# Patient Record
Sex: Female | Born: 1990 | Race: Black or African American | Hispanic: No | Marital: Married | Smoking: Never smoker
Health system: Southern US, Community
[De-identification: ages and names within clinical notes are randomized; demographics above are authoritative.]

## PROBLEM LIST (undated history)

## (undated) DIAGNOSIS — Z789 Other specified health status: Secondary | ICD-10-CM

## (undated) HISTORY — DX: Other specified health status: Z78.9

## (undated) HISTORY — PX: NO PAST SURGERIES: SHX2092

---
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
## 2011-03-13 ENCOUNTER — Encounter (HOSPITAL_COMMUNITY): Payer: Self-pay | Admitting: *Deleted

## 2011-03-13 ENCOUNTER — Emergency Department (HOSPITAL_COMMUNITY): Payer: BC Managed Care – PPO

## 2011-03-13 ENCOUNTER — Other Ambulatory Visit: Payer: Self-pay

## 2011-03-13 ENCOUNTER — Emergency Department (HOSPITAL_COMMUNITY)
Admission: EM | Admit: 2011-03-13 | Discharge: 2011-03-13 | Disposition: A | Discharge status: Home / Self Care | Payer: BC Managed Care – PPO | Attending: Emergency Medicine | Admitting: Emergency Medicine

## 2011-03-13 DIAGNOSIS — J45909 Unspecified asthma, uncomplicated: Secondary | ICD-10-CM | POA: Insufficient documentation

## 2011-03-13 DIAGNOSIS — R55 Syncope and collapse: Secondary | ICD-10-CM

## 2011-03-13 DIAGNOSIS — E119 Type 2 diabetes mellitus without complications: Secondary | ICD-10-CM | POA: Insufficient documentation

## 2011-03-13 DIAGNOSIS — R079 Chest pain, unspecified: Secondary | ICD-10-CM | POA: Insufficient documentation

## 2011-03-13 LAB — POCT I-STAT, CHEM 8
BUN: 7 mg/dL (ref 6–23)
Calcium, Ion: 1.24 mmol/L (ref 1.12–1.32)
Chloride: 111 mEq/L (ref 96–112)
Creatinine, Ser: 0.7 mg/dL (ref 0.50–1.10)
Glucose, Bld: 114 mg/dL — ABNORMAL HIGH (ref 70–99)
HCT: 42 % (ref 36.0–46.0)
Hemoglobin: 14.3 g/dL (ref 12.0–15.0)
Potassium: 3.6 mEq/L (ref 3.5–5.1)
Sodium: 145 mEq/L (ref 135–145)
TCO2: 23 mmol/L (ref 0–100)

## 2011-03-13 LAB — PREGNANCY, URINE: Preg Test, Ur: NEGATIVE
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
## 2011-03-13 NOTE — ED Notes (Signed)
Patient transported to X-ray 

## 2011-03-13 NOTE — ED Notes (Signed)
MD at bedside. 
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
## 2011-06-08 ENCOUNTER — Encounter (HOSPITAL_COMMUNITY): Payer: Self-pay | Admitting: Emergency Medicine

## 2011-06-08 ENCOUNTER — Emergency Department (HOSPITAL_COMMUNITY)
Admission: EM | Admit: 2011-06-08 | Discharge: 2011-06-08 | Disposition: A | Discharge status: Home / Self Care | Payer: BC Managed Care – PPO | Attending: Emergency Medicine | Admitting: Emergency Medicine

## 2011-06-08 DIAGNOSIS — R1013 Epigastric pain: Secondary | ICD-10-CM | POA: Insufficient documentation

## 2011-06-08 DIAGNOSIS — R197 Diarrhea, unspecified: Secondary | ICD-10-CM | POA: Insufficient documentation

## 2011-06-08 DIAGNOSIS — R112 Nausea with vomiting, unspecified: Secondary | ICD-10-CM | POA: Insufficient documentation

## 2011-06-08 LAB — URINALYSIS, ROUTINE W REFLEX MICROSCOPIC
Bilirubin Urine: NEGATIVE
Glucose, UA: NEGATIVE mg/dL
Hgb urine dipstick: NEGATIVE
Ketones, ur: >80 mg/dL — AB
Nitrite: NEGATIVE
Protein, ur: NEGATIVE mg/dL
Specific Gravity, Urine: 1.022 (ref 1.005–1.030)
Urobilinogen, UA: 1 mg/dL (ref 0.0–1.0)
pH: 8 (ref 5.0–8.0)

## 2011-06-08 LAB — CBC
HCT: 40.5 % (ref 36.0–46.0)
Hemoglobin: 13.2 g/dL (ref 12.0–15.0)
MCH: 29.7 pg (ref 26.0–34.0)
MCHC: 32.6 g/dL (ref 30.0–36.0)
MCV: 91.2 fL (ref 78.0–100.0)
Platelets: 259 10*3/uL (ref 150–400)
RBC: 4.44 MIL/uL (ref 3.87–5.11)
RDW: 12.8 % (ref 11.5–15.5)
WBC: 6.8 10*3/uL (ref 4.0–10.5)

## 2011-06-08 LAB — DIFFERENTIAL
Basophils Absolute: 0 10*3/uL (ref 0.0–0.1)
Basophils Relative: 0 % (ref 0–1)
Eosinophils Absolute: 0 10*3/uL (ref 0.0–0.7)
Eosinophils Relative: 0 % (ref 0–5)
Lymphocytes Relative: 8 % — ABNORMAL LOW (ref 12–46)
Lymphs Abs: 0.6 10*3/uL — ABNORMAL LOW (ref 0.7–4.0)
Monocytes Absolute: 0.2 10*3/uL (ref 0.1–1.0)
Monocytes Relative: 3 % (ref 3–12)
Neutro Abs: 6 10*3/uL (ref 1.7–7.7)
Neutrophils Relative %: 89 % — ABNORMAL HIGH (ref 43–77)

## 2011-06-08 LAB — COMPREHENSIVE METABOLIC PANEL
ALT: 9 U/L (ref 0–35)
AST: 15 U/L (ref 0–37)
Albumin: 3.8 g/dL (ref 3.5–5.2)
Alkaline Phosphatase: 64 U/L (ref 39–117)
BUN: 8 mg/dL (ref 6–23)
CO2: 22 mEq/L (ref 19–32)
Calcium: 9.1 mg/dL (ref 8.4–10.5)
Chloride: 104 mEq/L (ref 96–112)
Creatinine, Ser: 0.64 mg/dL (ref 0.50–1.10)
GFR calc Af Amer: >90 mL/min (ref >90)
GFR calc non Af Amer: >90 mL/min (ref >90)
Glucose, Bld: 114 mg/dL — ABNORMAL HIGH (ref 70–99)
Potassium: 3.9 mEq/L (ref 3.5–5.1)
Sodium: 139 mEq/L (ref 135–145)
Total Bilirubin: 0.3 mg/dL (ref 0.3–1.2)
Total Protein: 7.4 g/dL (ref 6.0–8.3)

## 2011-06-08 LAB — POCT PREGNANCY, URINE: Preg Test, Ur: NEGATIVE

## 2011-06-08 LAB — LIPASE, BLOOD: Lipase: 15 U/L (ref 11–59)

## 2011-06-08 LAB — URINE MICROSCOPIC-ADD ON

## 2011-06-08 MED ORDER — HYDROCODONE-ACETAMINOPHEN 5-325 MG PO TABS: 1.0000 | ORAL_TABLET | ORAL | Status: AC | PRN

## 2011-06-08 MED ORDER — ONDANSETRON 4 MG PO TBDP: 4.0000 mg | ORAL_TABLET | Freq: Once | ORAL | Status: DC

## 2011-06-08 MED ORDER — ONDANSETRON 4 MG PO TBDP
4.0000 mg | ORAL_TABLET | Freq: Once | ORAL | Status: AC
  Administered 2011-06-08: 4 mg via ORAL
  Filled 2011-06-08: qty 1

## 2011-06-08 MED ORDER — SODIUM CHLORIDE 0.9 % IV BOLUS (SEPSIS)
1000.0000 mL | Freq: Once | INTRAVENOUS | Status: AC
  Administered 2011-06-08: 1000 mL via INTRAVENOUS

## 2011-06-08 MED ORDER — MORPHINE SULFATE 4 MG/ML IJ SOLN
4.0000 mg | Freq: Once | INTRAMUSCULAR | Status: AC
  Administered 2011-06-08: 4 mg via INTRAVENOUS
  Filled 2011-06-08: qty 1

## 2011-06-08 MED ORDER — ONDANSETRON HCL 4 MG/2ML IJ SOLN
INTRAMUSCULAR | Status: AC
  Administered 2011-06-08: 4 mg
  Filled 2011-06-08: qty 2

## 2011-06-08 MED ORDER — ONDANSETRON 4 MG PO TBDP: 4.0000 mg | ORAL_TABLET | Freq: Three times a day (TID) | ORAL | Status: AC | PRN

## 2011-06-08 NOTE — ED Notes (Signed)
Pt stated that she is not ready to urinate.

## 2011-06-08 NOTE — ED Notes (Signed)
Pt states she has vomiting for the past two days  Unable to hold anything down  Pt states she has had diarrhea also

## 2011-06-08 NOTE — ED Notes (Signed)
Pt and family updated on status.

## 2011-06-08 NOTE — Discharge Instructions (Signed)
You have been treated for nausea and vomiting today. Your labs today did not show any worrisome findings. You have been given prescriptions for pain and nausea medications. Take these as prescribed. Stick to a clear liquid diet during the day today. If your pain is not improving, changes location, or you are otherwise worsening, return to the ER.  RESOURCE GUIDE  Dental Problems  Patients with Medicaid: [facility] [phone]                                           [phone] [address] Phone:  [phone]                                                  Phone:  [phone]  If unable to pay or uninsured, contact:  [facility] or [facility]. to become qualified for the adult dental clinic.  Chronic Pain Problems Contact [facility]  [phone] Patients need to be referred by their primary care doctor.  Insufficient Money for Medicine Contact [employer]:  call "211" or [facility] ([phone].  No Primary Care Doctor Call [facility]  [phone] Other agencies that provide inexpensive medical care    [facility]  [phone]    [facility]  [phone]    [facility]  [phone]    [facility]  [phone]    [facility]  [phone]    [facility]  [phone]  Psychological Services [facility] Behavioral Health  [phone] [facility]  [phone] [facility] Mental Health   [phone] (emergency services ([phone])  Substance Abuse Resources [facility]  [phone] [facility] [phone] [facility] [place] [phone] Floydene Flock [phone] Residential & Outpatient Substance Abuse Program  [phone]  Abuse/Neglect [facility] Child Abuse Hotline (708)028-9613 [facility] Child Abuse Hotline 249-422-9623 (After Hours)  Emergency Shelter [facility] [phone]  Maternity Homes Room at the [place] [facility] [place] [phone] [facility] [phone]  MRSA Hotline #:   (267)237-9546    [facility] Resources  [facility]     [employer]                          [facility] Dept. [address] [place]                       [address]      [address]  [place]                                                Cristobal Goldmann Phone:  [phone]  Phone:  [phone]                 Phone:  [phone]  [facility] Mental Health Phone:  [phone]  [facility] Child Abuse Hotline (917)286-6516 9376054823 (After Hours)  Clear Liquid Diet The clear liquid dietconsists of foods that are liquid or will become liquid at room temperature.You should be able to see through the liquid and beverages. Examples of foods allowed on a clear liquid diet include fruit juice, broth or bouillon, gelatin, or frozen ice pops. The purpose of this diet is to provide necessary fluid, electrolytes such as sodium and potassium, and energy to keep the body functioning during times when you are not able to consume a regular diet.A clear liquid diet should not be continued for long periods of time as it is not nutritionally adequate.  REASONS FOR USING A CLEAR LIQUID DIET  In sudden onset (acute) conditions for a patient before or after surgery.   As the first step in oral feeding.   For fluid and electrolyte replacement in diarrheal diseases.   As a diet before certain medical tests are performed.  ADEQUACY The clear liquid diet is adequate only in ascorbic acid, according to the Recommended Dietary Allowances of the [employer]. CHOOSING FOODS Breads and Starches  Allowed:  None are allowed.   Avoid: All are avoided.  Vegetables  Allowed:  Strained tomato or vegetable juice.   Avoid: Any others.    Fruit  Allowed:  Strained fruit juices and fruit drinks. Include 1 serving of citrus or vitamin C-enriched fruit juice daily.   Avoid: Any others.  Meat and Meat Substitutes  Allowed:  None are allowed.   Avoid: All are avoided.  Milk  Allowed:  None are allowed.   Avoid: All are avoided.  Soups and Combination Foods  Allowed:  Clear bouillon, broth, or strained broth-based soups.   Avoid: Any others.  Desserts and Sweets  Allowed:  Sugar, honey. High protein gelatin. Flavored gelatin, ices, or frozen ice pops that do not contain milk.   Avoid: Any others.  Fats and Oils  Allowed:  None are allowed.   Avoid: All are avoided.  Beverages  Allowed: Cereal beverages, coffee (regular or decaffeinated), tea, or soda at the discretion of your caregiver.   Avoid: Any others.  Condiments  Allowed:  Iodized salt.   Avoid: Any others, including pepper.  Supplements  Allowed:  Liquid nutrition beverages.   Avoid: Any others that contain lactose or fiber.  SAMPLE MEAL PLAN Breakfast  4 oz (120 mL) strained orange juice.    to 1 cup (125 to 250 mL) gelatin (plain or fortified).   1 cup (250 mL) beverage (coffee or tea).   Sugar, if desired.  Midmorning Snack   cup (125 mL) gelatin (plain or fortified).  Lunch  1 cup (250 mL) broth or consomm.   4 oz (120 mL) strained grapefruit juice.    cup (125 mL) gelatin (plain or fortified).   1 cup (250 mL) beverage (coffee or tea).   Sugar, if desired.  Midafternoon Snack   cup (125 mL) fruit ice.    cup (125 mL) strained fruit juice.  Dinner  1 cup (250 mL) broth or consomm.    cup (125 mL) cranberry juice.    cup (125 mL) flavored gelatin (plain or fortified).   1 cup (250 mL) beverage (coffee or tea).   Sugar, if desired.  Evening Snack  4 oz (120  mL) strained apple juice (vitamin C-fortified).    cup (125 mL) flavored gelatin (plain or fortified).  Document Released: 02/15/2005  Document Revised: 02/04/2011 Document Reviewed: 05/15/2010 [facility] Patient Information 2012 [place], [place].Nausea and Vomiting Nausea is a sick feeling that often comes before throwing up (vomiting). Vomiting is a reflex where stomach contents come out of your mouth. Vomiting can cause severe loss of body fluids (dehydration). Children and elderly adults can become dehydrated quickly, especially if they also have diarrhea. Nausea and vomiting are symptoms of a condition or disease. It is important to find the cause of your symptoms. CAUSES   Direct irritation of the stomach lining. This irritation can result from increased acid production (gastroesophageal reflux disease), infection, food poisoning, taking certain medicines (such as nonsteroidal anti-inflammatory drugs), alcohol use, or tobacco use.   Signals from the brain.These signals could be caused by a headache, heat exposure, an inner ear disturbance, increased pressure in the brain from injury, infection, a tumor, or a concussion, pain, emotional stimulus, or metabolic problems.   An obstruction in the gastrointestinal tract (bowel obstruction).   Illnesses such as diabetes, hepatitis, gallbladder problems, appendicitis, kidney problems, cancer, sepsis, atypical symptoms of a heart attack, or eating disorders.   Medical treatments such as chemotherapy and radiation.   Receiving medicine that makes you sleep (general anesthetic) during surgery.  DIAGNOSIS Your caregiver may ask for tests to be done if the problems do not improve after a few days. Tests may also be done if symptoms are severe or if the reason for the nausea and vomiting is not clear. Tests may include:  Urine tests.   Blood tests.   Stool tests.   Cultures (to look for evidence of infection).   X-rays or other imaging studies.  Test results can help your caregiver make decisions about treatment or the need for additional tests. TREATMENT You need to stay well  hydrated. Drink frequently but in small amounts.You may wish to drink water, sports drinks, clear broth, or eat frozen ice pops or gelatin dessert to help stay hydrated.When you eat, eating slowly may help prevent nausea.There are also some antinausea medicines that may help prevent nausea. HOME CARE INSTRUCTIONS   Take all medicine as directed by your caregiver.   If you do not have an appetite, do not force yourself to eat. However, you must continue to drink fluids.   If you have an appetite, eat a normal diet unless your caregiver tells you differently.   Eat a variety of complex carbohydrates (rice, wheat, potatoes, bread), lean meats, yogurt, fruits, and vegetables.   Avoid high-fat foods because they are more difficult to digest.   Drink enough water and fluids to keep your urine clear or pale yellow.   If you are dehydrated, ask your caregiver for specific rehydration instructions. Signs of dehydration may include:   Severe thirst.   Dry lips and mouth.   Dizziness.   Dark urine.   Decreasing urine frequency and amount.   Confusion.   Rapid breathing or pulse.  SEEK IMMEDIATE MEDICAL CARE IF:   You have blood or brown flecks (like coffee grounds) in your vomit.   You have black or bloody stools.   You have a severe headache or stiff neck.   You are confused.   You have severe abdominal pain.   You have chest pain or trouble breathing.   You do not urinate at least once every 8 hours.   You develop cold or clammy skin.  You continue to vomit for longer than 24 to 48 hours.   You have a fever.  MAKE SURE YOU:   Understand these instructions.   Will watch your condition.   Will get help right away if you are not doing well or get worse.  Document Released: 02/15/2005 Document Revised: 02/04/2011 Document Reviewed: 07/15/2010 [facility] Patient Information 2012 [place], [place].

## 2011-06-08 NOTE — ED Notes (Signed)
Pt actively vomiting in triage 

## 2011-06-08 NOTE — ED Notes (Signed)
Report received from Night RN. First contact with patient. Pt is up for discharge upon taking over this pt's care. Will get discharge instruction and discharge pt.

## 2011-06-08 NOTE — ED Provider Notes (Signed)
History     CSN: [id number]  Arrival date & time 06/08/11  0105   First MD Initiated Contact with Patient 06/08/11 0243      Chief Complaint  Patient presents with  . Emesis    (Consider location/radiation/quality/duration/timing/severity/associated sxs/prior treatment) Patient is a 21 y.o. female presenting with vomiting. The history is provided by the patient.  Emesis  This is a new problem. The current episode started 2 days ago. The problem occurs 5 to 10 times per day. The problem has not changed since onset.The emesis has an appearance of stomach contents. There has been no fever. Associated symptoms include abdominal pain and diarrhea. Pertinent negatives include no arthralgias, no chills, no cough, no fever, no headaches, no myalgias, no sweats and no URI.   N/V/D x past 2 days with epigastric abd pain that developed after several episodes of vomiting. No f/c, chest pain, shortness of breath, leg swelling. No vaginal bleeding, dc, urinary sx. Nonbilious, nonbloody emesis. Diarrhea described as multiple episodes of loose stools. She is unable to tolerate PO. No known sick contacts, travel, or suspect food intake.  History reviewed. No pertinent past medical history.  History reviewed. No pertinent past surgical history.  History reviewed. No pertinent family history.  History  Substance Use Topics  . Smoking status: Never Smoker   . Smokeless tobacco: Not on file  . Alcohol Use: Yes     social    OB History    Grav Para Term Preterm Abortions TAB SAB Ect Mult Living                  Review of Systems  Constitutional: Negative for fever and chills.  HENT: Negative.   Respiratory: Negative for cough.   Cardiovascular: Negative for chest pain.  Gastrointestinal: Positive for nausea, vomiting, abdominal pain and diarrhea. Negative for constipation, blood in stool, abdominal distention, anal bleeding and rectal pain.  Genitourinary: Negative for dysuria, flank pain,  vaginal bleeding and vaginal discharge.  Musculoskeletal: Negative for myalgias and arthralgias.  Skin: Negative.   Neurological: Negative for headaches.    Allergies  Review of patient's allergies indicates no known allergies.  Home Medications   Current Outpatient Rx  Name Route Sig Dispense Refill  . IBUPROFEN 200 MG PO TABS Oral Take 800 mg by mouth every 6 (six) hours as needed. For pain/fever/cramping    . HYDROCORTISONE PO Topical Apply 1 application topically daily as needed. For itching      BP 125/56  Pulse 92  Temp(Src) 98.5 F (36.9 C) (Oral)  Resp 18  Wt 178 lb 3.2 oz (80.831 kg)  SpO2 100%  LMP 06/01/2011  Physical Exam  Nursing note and vitals reviewed. Constitutional: She appears well-developed and well-nourished. No distress.       Uncomfortable appearing  HENT:  Head: Normocephalic and atraumatic.  Right Ear: External ear normal.  Left Ear: External ear normal.       Mucus membranes dry appearing  Eyes: EOM are normal. Pupils are equal, round, and reactive to light.  Neck: Normal range of motion.  Cardiovascular: Normal rate, regular rhythm and normal heart sounds.   Pulmonary/Chest: Effort normal and breath sounds normal. She exhibits no tenderness.  Abdominal: Soft. Bowel sounds are normal. There is tenderness. There is no rebound and no guarding.       Slight epigastric abd tenderness without guarding  Musculoskeletal: Normal range of motion.  Neurological: She is alert.  Skin: Skin is warm and dry. No rash  noted. She is not diaphoretic.       Nl skin turgor  Psychiatric: She has a normal mood and affect.    ED Course  Procedures (including critical care time)  Labs Reviewed  DIFFERENTIAL - Abnormal; Notable for the following:    Neutrophils Relative 89 (*)    Lymphocytes Relative 8 (*)    Lymphs Abs 0.6 (*)    All other components within normal limits  COMPREHENSIVE METABOLIC PANEL - Abnormal; Notable for the following:    Glucose, Bld  114 (*)    All other components within normal limits  URINALYSIS, ROUTINE W REFLEX MICROSCOPIC - Abnormal; Notable for the following:    APPearance TURBID (*)    Ketones, ur >80 (*)    Leukocytes, UA SMALL (*)    All other components within normal limits  URINE MICROSCOPIC-ADD ON - Abnormal; Notable for the following:    Squamous Epithelial / LPF FEW (*)    Bacteria, UA FEW (*)    All other components within normal limits  LIPASE, BLOOD  CBC  POCT PREGNANCY, URINE   No results found.   1. Nausea and vomiting       MDM  Pt presents with n/v/d x 2 days, no PO intake. Appears slightly dehydrated. VSS. No lyte abnormalities but she does have >80 ketones in urine. Rehydrated here with 2L NS and feeling better with this. After zofran, PO chall attempted which she was able to tolerate.   Given abd exam and labs, I doubt acute abd at this point. Suspect likely viral process. However, pt and mom advised that if pain changes, worsens, or migrates toward RLQ, she is to return immediately for serial exam. Pt and mom verbalized understanding and were agreeable with plan.       Grant Fontana, [place] 06/10/11 1243

## 2011-06-08 NOTE — ED Notes (Signed)
Unable to gain iv access; two attempts performed.

## 2011-06-12 NOTE — ED Provider Notes (Signed)
Medical screening examination/treatment/procedure(s) were performed by non-physician practitioner and as supervising physician I was immediately available for consultation/collaboration.  Alicea Wente M Valeriano Bain, MD 06/12/11 0122 

## 2011-06-14 ENCOUNTER — Encounter (HOSPITAL_COMMUNITY): Payer: Self-pay | Admitting: Emergency Medicine

## 2012-06-26 ENCOUNTER — Ambulatory Visit (INDEPENDENT_AMBULATORY_CARE_PROVIDER_SITE_OTHER): Payer: BC Managed Care – PPO | Admitting: Obstetrics & Gynecology

## 2012-06-26 ENCOUNTER — Encounter: Payer: Self-pay | Admitting: Obstetrics & Gynecology

## 2012-06-26 VITALS — BP 100/68 | HR 84 | Temp 97.5°F | Ht 63.0 in | Wt 175.0 lb

## 2012-06-26 DIAGNOSIS — N946 Dysmenorrhea, unspecified: Secondary | ICD-10-CM

## 2012-06-26 DIAGNOSIS — R55 Syncope and collapse: Secondary | ICD-10-CM

## 2012-06-26 DIAGNOSIS — Z3202 Encounter for pregnancy test, result negative: Secondary | ICD-10-CM

## 2012-06-26 DIAGNOSIS — R87610 Atypical squamous cells of undetermined significance on cytologic smear of cervix (ASC-US): Secondary | ICD-10-CM | POA: Insufficient documentation

## 2012-06-26 LAB — POCT URINE PREGNANCY: Preg Test, Ur: NEGATIVE

## 2012-06-26 MED ORDER — LEVONORGEST-ETH ESTRAD 91-DAY 0.15-0.03 &0.01 MG PO TABS: 1.0000 | ORAL_TABLET | Freq: Every day | ORAL | Status: DC

## 2012-06-26 NOTE — Progress Notes (Signed)
Subjective:    April [name] is a 22 y.o. female who presents for evaluation of menstrual symptoms "passing out". Symptoms began 5 days ago. Patient describes symptoms of anxiety (mild), bloating/fluid retention (moderate), breast tenderness (mild) and menstrual cramping (severe). Symptoms occur 2 days prior to onset of menses. Patient denies decreased libido, dyspareunia, insomnia, labile mood, menorrhagia and migraine headaches.  Treatment to date includes: Oral contraceptive pills per medication list:(somewhat effective). The patient is sexually active.   Menstrual History: OB History   Grav Para Term Preterm Abortions TAB SAB Ect Mult Living   0 0 0 0 0 0 0 0 0 0       Menarche age: 44  Patient's last menstrual period was 06/21/2012.    The following portions of the patient's history were reviewed and updated as appropriate: allergies, current medications, past family history, past medical history, past social history, past surgical history and problem list.  Review of Systems Pertinent items are noted in HPI.   Objective:    BP 107/74  Pulse 84  Temp(Src) 97.5 F (36.4 C) (Oral)  Ht 5\' 3"  (1.6 m)  Wt 175 lb (79.379 kg)  BMI 31.01 kg/m2  LMP 06/21/2012 Exam deferred  Assessment:    PMS/dysmenorrhea ?Presyncope--not orthostatic by vital signs  Plan:  Encouraged f/u w/primary care provider Return prn

## 2012-06-26 NOTE — Patient Instructions (Signed)
Near-Syncope  Near-syncope is sudden weakness, dizziness, or feeling like you might pass out (faint). This may occur when getting up after sitting or while standing for a long period of time. Near-syncope can be caused by a drop in blood pressure. This is a common reaction, but it may occur to a greater degree in people taking medicines to control their blood pressure. Fainting often occurs when the blood pressure or pulse is too low to provide enough blood flow to the brain to keep you conscious. Fainting and near-syncope are not usually due to serious medical problems. However, certain people should be more cautious in the event of near-syncope, including elderly patients, patients with diabetes, and patients with a history of heart conditions (especially irregular rhythms).   CAUSES    Drop in blood pressure.   Physical pain.   Dehydration.   Heat exhaustion.   Emotional distress.   Low blood sugar.   Internal bleeding.   Heart and circulatory problems.   Infections.  SYMPTOMS    Dizziness.   Feeling sick to your stomach (nauseous).   Nearly fainting.   Body numbness.   Turning pale.   Tunnel vision.   Weakness.  HOME CARE INSTRUCTIONS    Lie down right away if you start feeling like you might faint. Breathe deeply and steadily. Wait until all the symptoms have passed. Most of these episodes last only a few minutes. You may feel tired for several hours.   Drink enough fluids to keep your urine clear or pale yellow.   If you are taking blood pressure or heart medicine, get up slowly, taking several minutes to sit and then stand. This can reduce dizziness that is caused by a drop in blood pressure.  SEEK IMMEDIATE MEDICAL CARE IF:    You have a severe headache.   Unusual pain develops in the chest, abdomen, or back.   There is bleeding from the mouth or rectum, or you have black or tarry stool.   An irregular heartbeat or a very rapid pulse develops.   You have repeated fainting or  seizure-like jerking during an episode.   You faint when sitting or lying down.   You develop confusion.   You have difficulty walking.   Severe weakness develops.   Vision problems develop.  MAKE SURE YOU:    Understand these instructions.   Will watch your condition.   Will get help right away if you are not doing well or get worse.  Document Released: 02/15/2005 Document Revised: 05/10/2011 Document Reviewed: 04/03/2010  [employer] Patient Information 2013 [employer].

## 2013-11-06 ENCOUNTER — Telehealth: Payer: Self-pay | Admitting: *Deleted

## 2013-11-06 NOTE — Telephone Encounter (Signed)
Patient called stating she was requesting birth control pills. Patient states she would like an Rx sent for generic BC pill to [facility] on [address] and [address].   CB: 3:13pm, LM stating patient needs to be schedule for an appointment and since patient does not get off work until 6:00pm for the patient to contact the office tomorrow morning at 9:00am to schedule an appointment.   Patient is due for an annual exam. Last Pap-smear was in 12/2011, results were ASC-US.

## 2014-06-11 ENCOUNTER — Ambulatory Visit (INDEPENDENT_AMBULATORY_CARE_PROVIDER_SITE_OTHER): Payer: BLUE CROSS/BLUE SHIELD | Admitting: Emergency Medicine

## 2014-06-11 VITALS — BP 110/64 | HR 74 | Temp 98.2°F | Resp 20 | Ht 63.0 in | Wt 232.1 lb

## 2014-06-11 DIAGNOSIS — A084 Viral intestinal infection, unspecified: Secondary | ICD-10-CM | POA: Diagnosis not present

## 2014-06-11 MED ORDER — ONDANSETRON 8 MG PO TBDP: 8.0000 mg | ORAL_TABLET | Freq: Three times a day (TID) | ORAL | Status: DC | PRN

## 2014-06-11 MED ORDER — LOPERAMIDE HCL 2 MG PO TABS
ORAL_TABLET | ORAL | Status: DC
Start: 2014-06-11 — End: 2015-06-06

## 2014-06-11 NOTE — Progress Notes (Signed)
[facility] [address], [place] [address] [phone]  Date:  06/11/2014   Name:  April [name]   DOB:  11/29/1990   MRN:  [id number]  PCP:  Cala BradfordWHITE,CYNTHIA S, MD    Chief Complaint: Abdominal Pain; Diarrhea; and Emesis   History of Present Illness:  April [name] is a 24 y.o. very pleasant female patient who presents with the following:  Well on retiring last night.  Today awoke with nausea and vomiting.  Some diarrhea The patient has no complaint of blood, mucous, or pus in her stools. No sketchy water or travel No ill contact Feels hot and alternately chilled with no fever documented. Some lower abdominal cramping but started menses today Poor po intake today. No provocative or ameliorating factors noted. No improvement with over the counter medications or other home remedies.  Denies other complaint or health concern today.   Patient Active Problem List   Diagnosis Date Noted  . Papanicolaou smear of cervix with atypical squamous cells of undetermined significance (ASC-US) 06/26/2012  . Dysmenorrhea 06/26/2012    Past Medical History  Diagnosis Date  . Medical history non-contributory     Past Surgical History  Procedure Laterality Date  . No past surgeries      History  Substance Use Topics  . Smoking status: Never Smoker   . Smokeless tobacco: Never Used  . Alcohol Use: 0.0 oz/week    0 Standard drinks or equivalent per week     Comment: social    Family History  Problem Relation Age of Onset  . Diabetes Father   . Heart disease Maternal Grandmother   . Lung cancer Paternal Grandfather     smoker    No Known Allergies  Medication list has been reviewed and updated.  Current Outpatient Prescriptions on File Prior to Visit  Medication Sig Dispense Refill  . ibuprofen (ADVIL,MOTRIN) 200 MG tablet Take 800 mg by mouth every 6 (six) hours as needed. For pain/fever/cramping     No current facility-administered  medications on file prior to visit.    Review of Systems:  As per HPI, otherwise negative.    Physical Examination: Filed Vitals:   06/11/14 1003  BP: 110/64  Pulse: 74  Temp: 98.2 F (36.8 C)  Resp: 20   Filed Vitals:   06/11/14 1003  Height: 5\' 3"  (1.6 m)  Weight: 232 lb 2 oz (105.291 kg)   Body mass index is 41.13 kg/(m^2). Ideal Body Weight: Weight in (lb) to have BMI = 25: 140.8  GEN: obese, moderate distress, Non-toxic, A & O x 3 HEENT: Atraumatic, Normocephalic. Neck supple. No masses, No LAD. Ears and Nose: No external deformity. CV: RRR, No M/G/R. No JVD. No thrill. No extra heart sounds. PULM: CTA B, no wheezes, crackles, rhonchi. No retractions. No resp. distress. No accessory muscle use. ABD: S, NT, ND, +BS. No rebound. No HSM. EXTR: No c/c/e NEURO Normal gait.  PSYCH: Normally interactive. Conversant. Not depressed or anxious appearing.  Calm demeanor.    Assessment and Plan: Viral gastroenteritis zofran Imodium Clears  Signed,  Phillips OdorJeffery Nelida Mandarino, MD

## 2014-06-11 NOTE — Patient Instructions (Signed)
Clear Liquid Diet A clear liquid diet is a short-term diet that is prescribed to provide the necessary fluid and basic energy you need when you can have nothing else. The clear liquid diet consists of liquids or solids that will become liquid at room temperature. You should be able to see through the liquid. There are many reasons that you may be restricted to clear liquids, such as:  When you have a sudden-onset (acute) condition that occurs before or after surgery.  To help your body slowly get adjusted to food again after a long period when you were unable to have food.  Replacement of fluids when you have a diarrheal disease.  When you are going to have certain exams, such as a colonoscopy, in which instruments are inserted inside your body to look at parts of your digestive system. WHAT CAN I HAVE? A clear liquid diet does not provide all the nutrients you need. It is important to choose a variety of the following items to get as many nutrients as possible:  Vegetable juices that do not have pulp.  Fruit juices and fruit drinks that do not have pulp.  Coffee (regular or decaffeinated), tea, or soda at the discretion of your health care provider.  Clear bouillon, broth, or strained broth-based soups.  High-protein and flavored gelatins.  Sugar or honey.  Ices or frozen ice pops that do not contain milk. If you are not sure whether you can have certain items, you should ask your health care provider. You may also ask your health care provider if there are any other clear liquid options. Document Released: 02/15/2005 Document Revised: 02/20/2013 Document Reviewed: 01/12/2013 ExitCare Patient Information 2015 [employer]. This information is not intended to replace advice given to you by your health care provider. Make sure you discuss any questions you have with your health care provider. Viral Gastroenteritis Viral gastroenteritis is also known as stomach flu. This condition  affects the stomach and intestinal tract. It can cause sudden diarrhea and vomiting. The illness typically lasts 3 to 8 days. Most people develop an immune response that eventually gets rid of the virus. While this natural response develops, the virus can make you quite ill. CAUSES  Many different viruses can cause gastroenteritis, such as rotavirus or noroviruses. You can catch one of these viruses by consuming contaminated food or water. You may also catch a virus by sharing utensils or other personal items with an infected person or by touching a contaminated surface. SYMPTOMS  The most common symptoms are diarrhea and vomiting. These problems can cause a severe loss of body fluids (dehydration) and a body salt (electrolyte) imbalance. Other symptoms may include:  Fever.  Headache.  Fatigue.  Abdominal pain. DIAGNOSIS  Your caregiver can usually diagnose viral gastroenteritis based on your symptoms and a physical exam. A stool sample may also be taken to test for the presence of viruses or other infections. TREATMENT  This illness typically goes away on its own. Treatments are aimed at rehydration. The most serious cases of viral gastroenteritis involve vomiting so severely that you are not able to keep fluids down. In these cases, fluids must be given through an intravenous line (IV). HOME CARE INSTRUCTIONS   Drink enough fluids to keep your urine clear or pale yellow. Drink small amounts of fluids frequently and increase the amounts as tolerated.  Ask your caregiver for specific rehydration instructions.  Avoid:  Foods high in sugar.  Alcohol.  Carbonated drinks.  Tobacco.  Juice.    Caffeine drinks.  Extremely hot or cold fluids.  Fatty, greasy foods.  Too much intake of anything at one time.  Dairy products until 24 to 48 hours after diarrhea stops.  You may consume probiotics. Probiotics are active cultures of beneficial bacteria. They may lessen the amount and  number of diarrheal stools in adults. Probiotics can be found in yogurt with active cultures and in supplements.  Wash your hands well to avoid spreading the virus.  Only take over-the-counter or prescription medicines for pain, discomfort, or fever as directed by your caregiver. Do not give aspirin to children. Antidiarrheal medicines are not recommended.  Ask your caregiver if you should continue to take your regular prescribed and over-the-counter medicines.  Keep all follow-up appointments as directed by your caregiver. SEEK IMMEDIATE MEDICAL CARE IF:   You are unable to keep fluids down.  You do not urinate at least once every 6 to 8 hours.  You develop shortness of breath.  You notice blood in your stool or vomit. This may look like coffee grounds.  You have abdominal pain that increases or is concentrated in one small area (localized).  You have persistent vomiting or diarrhea.  You have a fever.  The patient is a child younger than 3 months, and he or she has a fever.  The patient is a child older than 3 months, and he or she has a fever and persistent symptoms.  The patient is a child older than 3 months, and he or she has a fever and symptoms suddenly get worse.  The patient is a baby, and he or she has no tears when crying. MAKE SURE YOU:   Understand these instructions.  Will watch your condition.  Will get help right away if you are not doing well or get worse. Document Released: 02/15/2005 Document Revised: 05/10/2011 Document Reviewed: 12/02/2010 ExitCare Patient Information 2015 [employer]. This information is not intended to replace advice given to you by your health care provider. Make sure you discuss any questions you have with your health care provider.  

## 2014-07-16 ENCOUNTER — Ambulatory Visit (INDEPENDENT_AMBULATORY_CARE_PROVIDER_SITE_OTHER): Payer: BLUE CROSS/BLUE SHIELD | Admitting: Obstetrics

## 2014-07-16 VITALS — BP 122/73 | HR 69 | Temp 98.5°F | Wt 230.6 lb

## 2014-07-16 DIAGNOSIS — Z30011 Encounter for initial prescription of contraceptive pills: Secondary | ICD-10-CM | POA: Diagnosis not present

## 2014-07-16 DIAGNOSIS — N946 Dysmenorrhea, unspecified: Secondary | ICD-10-CM | POA: Diagnosis not present

## 2014-07-16 MED ORDER — LEVONORGESTREL-ETHINYL ESTRAD 0.15-30 MG-MCG PO TABS: 1.0000 | ORAL_TABLET | Freq: Every day | ORAL | Status: DC

## 2014-07-16 MED ORDER — IBUPROFEN 800 MG PO TABS: 800.0000 mg | ORAL_TABLET | Freq: Three times a day (TID) | ORAL | Status: DC | PRN

## 2014-07-17 ENCOUNTER — Encounter: Payer: Self-pay | Admitting: Obstetrics

## 2014-07-17 NOTE — Progress Notes (Signed)
Patient ID: April [name], female   DOB: 12/16/1990, 24 y.o.   MRN: [id number]  Chief Complaint  Patient presents with  . Follow-up    HPI April [name] is a 24 y.o. female.  Heavy and painful periods.  Wants contraception.  HPI  Past Medical History  Diagnosis Date  . Medical history non-contributory     Past Surgical History  Procedure Laterality Date  . No past surgeries      Family History  Problem Relation Age of Onset  . Diabetes Father   . Heart disease Maternal Grandmother   . Lung cancer Paternal Grandfather     smoker    Social History History  Substance Use Topics  . Smoking status: Never Smoker   . Smokeless tobacco: Never Used  . Alcohol Use: 0.0 oz/week    0 Standard drinks or equivalent per week     Comment: social    No Known Allergies  Current Outpatient Prescriptions  Medication Sig Dispense Refill  . ibuprofen (ADVIL,MOTRIN) 800 MG tablet Take 1 tablet (800 mg total) by mouth every 8 (eight) hours as needed. 30 tablet 5  . levonorgestrel-ethinyl estradiol (NORDETTE) 0.15-30 MG-MCG tablet Take 1 tablet by mouth daily. 1 Package 11  . loperamide (IMODIUM A-D) 2 MG tablet 2 now and one hourly prn diarrhea.  Max 8 tabs in 24 hours (Patient not taking: Reported on 07/16/2014) 30 tablet 0  . ondansetron (ZOFRAN-ODT) 8 MG disintegrating tablet Take 1 tablet (8 mg total) by mouth every 8 (eight) hours as needed for nausea. (Patient not taking: Reported on 07/16/2014) 30 tablet 0   No current facility-administered medications for this visit.    Review of Systems Review of Systems Constitutional: negative for fatigue and weight loss Respiratory: negative for cough and wheezing Cardiovascular: negative for chest pain, fatigue and palpitations Gastrointestinal: negative for abdominal pain and change in bowel habits Genitourinary:negative Integument/breast: negative for nipple discharge Musculoskeletal:negative for myalgias Neurological: negative  for gait problems and tremors Behavioral/Psych: negative for abusive relationship, depression Endocrine: negative for temperature intolerance     Blood pressure 122/73, pulse 69, temperature 98.5 F (36.9 C), weight 230 lb 9.6 oz (104.599 kg), last menstrual period 07/07/2014.  Physical Exam Physical Exam General:   alert  Skin:   no rash or abnormalities  Lungs:   clear to auscultation bilaterally  Heart:   regular rate and rhythm, S1, S2 normal, no murmur, click, rub or gallop  Breasts:   normal without suspicious masses, skin or nipple changes or axillary nodes  Abdomen:  normal findings: no organomegaly, soft, non-tender and no hernia  Pelvis:  External genitalia: normal general appearance Urinary system: urethral meatus normal and bladder without fullness, nontender Vaginal: normal without tenderness, induration or masses Cervix: normal appearance Adnexa: normal bimanual exam Uterus: anteverted and non-tender, normal size      Data Reviewed none  Assessment     Dysmenorrhea. Contraceptive management     Plan    Nordette 28 Rx Ibuprofen Rx F/U 4 months.  No orders of the defined types were placed in this encounter.   Meds ordered this encounter  Medications  . levonorgestrel-ethinyl estradiol (NORDETTE) 0.15-30 MG-MCG tablet    Sig: Take 1 tablet by mouth daily.    Dispense:  1 Package    Refill:  11  . ibuprofen (ADVIL,MOTRIN) 800 MG tablet    Sig: Take 1 tablet (800 mg total) by mouth every 8 (eight) hours as needed.    Dispense:  30 tablet    Refill:  5

## 2014-11-13 ENCOUNTER — Ambulatory Visit: Payer: BLUE CROSS/BLUE SHIELD | Admitting: Obstetrics

## 2015-06-06 ENCOUNTER — Encounter: Payer: Self-pay | Admitting: Obstetrics

## 2015-06-06 ENCOUNTER — Ambulatory Visit (INDEPENDENT_AMBULATORY_CARE_PROVIDER_SITE_OTHER): Payer: BLUE CROSS/BLUE SHIELD | Admitting: Certified Nurse Midwife

## 2015-06-06 VITALS — BP 136/80 | HR 93 | Temp 99.3°F | Wt 246.0 lb

## 2015-06-06 DIAGNOSIS — Z3009 Encounter for other general counseling and advice on contraception: Secondary | ICD-10-CM

## 2015-06-06 NOTE — Progress Notes (Signed)
Patient ID: April [name], female   DOB: 12/22/1990, 25 y.o.   MRN: [id number]  Chief Complaint  Patient presents with  . Gynecologic Exam    Consult- patient is in the office to discuss her birth control options.    HPI April [name] is a 25 y.o. female.  Here for contraception counseling.  Discussed various options.  Is currently taking her pill daily at the same time every day.  With a stable partner.  Desires more effective contraception.  Discussed Kyleena versus Nexplanon.  Information given to patient.  Patient desires to have IUD placed while on her period in May.  Instructions given to patient.      HPI  Past Medical History  Diagnosis Date  . Medical history non-contributory     Past Surgical History  Procedure Laterality Date  . No past surgeries      Family History  Problem Relation Age of Onset  . Diabetes Father   . Heart disease Maternal Grandmother   . Lung cancer Paternal Grandfather     smoker    Social History Social History  Substance Use Topics  . Smoking status: Never Smoker   . Smokeless tobacco: Never Used  . Alcohol Use: 0.0 oz/week    0 Standard drinks or equivalent per week     Comment: social    No Known Allergies  Current Outpatient Prescriptions  Medication Sig Dispense Refill  . ibuprofen (ADVIL,MOTRIN) 800 MG tablet Take 1 tablet (800 mg total) by mouth every 8 (eight) hours as needed. 30 tablet 5  . levonorgestrel-ethinyl estradiol (NORDETTE) 0.15-30 MG-MCG tablet Take 1 tablet by mouth daily. 1 Package 11   No current facility-administered medications for this visit.    Review of Systems Review of Systems Constitutional: negative for fatigue and weight loss Respiratory: negative for cough and wheezing Cardiovascular: negative for chest pain, fatigue and palpitations Gastrointestinal: negative for abdominal pain and change in bowel habits Genitourinary:negative Integument/breast: negative for nipple  discharge Musculoskeletal:negative for myalgias Neurological: negative for gait problems and tremors Behavioral/Psych: negative for abusive relationship, depression Endocrine: negative for temperature intolerance     Blood pressure 136/80, pulse 93, temperature 99.3 F (37.4 C), weight 246 lb (111.585 kg), last menstrual period 05/19/2015.  Physical Exam Physical Exam General:   alert  Skin:   no rash or abnormalities  Lungs:   clear to auscultation bilaterally  Heart:   regular rate and rhythm, S1, S2 normal, no murmur, click, rub or gallop  Breasts:   normal without suspicious masses, skin or nipple changes or axillary nodes  Abdomen:  normal findings: no organomegaly, soft, non-tender and no hernia  Pelvis:  External genitalia: normal general appearance Urinary system: urethral meatus normal and bladder without fullness, nontender Vaginal: normal without tenderness, induration or masses Cervix: normal appearance Adnexa: normal bimanual exam Uterus: anteverted and non-tender, normal size    100% of 15 min visit spent on counseling and coordination of care.   Data Reviewed Previous medical hx, meds  Assessment     Contraception counseling     Plan    No orders of the defined types were placed in this encounter.   No orders of the defined types were placed in this encounter.     Possible management options include: routiene exam Follow up in  May with IUD.

## 2015-07-09 ENCOUNTER — Ambulatory Visit (INDEPENDENT_AMBULATORY_CARE_PROVIDER_SITE_OTHER): Payer: BLUE CROSS/BLUE SHIELD | Admitting: Certified Nurse Midwife

## 2015-07-09 VITALS — BP 114/81 | HR 81 | Wt 242.0 lb

## 2015-07-09 DIAGNOSIS — Z30014 Encounter for initial prescription of intrauterine contraceptive device: Secondary | ICD-10-CM

## 2015-07-09 DIAGNOSIS — Z1389 Encounter for screening for other disorder: Secondary | ICD-10-CM | POA: Diagnosis not present

## 2015-07-09 DIAGNOSIS — N390 Urinary tract infection, site not specified: Secondary | ICD-10-CM

## 2015-07-09 DIAGNOSIS — Z30430 Encounter for insertion of intrauterine contraceptive device: Secondary | ICD-10-CM

## 2015-07-09 DIAGNOSIS — Z113 Encounter for screening for infections with a predominantly sexual mode of transmission: Secondary | ICD-10-CM

## 2015-07-09 DIAGNOSIS — Z3202 Encounter for pregnancy test, result negative: Secondary | ICD-10-CM

## 2015-07-09 DIAGNOSIS — Z01818 Encounter for other preprocedural examination: Secondary | ICD-10-CM

## 2015-07-09 LAB — POCT URINALYSIS DIPSTICK
Bilirubin, UA: NEGATIVE
Blood, UA: 250
Glucose, UA: NEGATIVE
Leukocytes, UA: NEGATIVE
Nitrite, UA: POSITIVE
Protein, UA: NEGATIVE
Spec Grav, UA: 1.01
Urobilinogen, UA: NEGATIVE
pH, UA: 7

## 2015-07-09 LAB — POCT URINE PREGNANCY: Preg Test, Ur: NEGATIVE

## 2015-07-09 MED ORDER — NITROFURANTOIN MONOHYD MACRO 100 MG PO CAPS: 100.0000 mg | ORAL_CAPSULE | Freq: Two times a day (BID) | ORAL | Status: AC

## 2015-07-09 NOTE — Progress Notes (Signed)
Patient ID: April [name], female   DOB: 12/02/1990, 25 y.o.   MRN: [id number]  IUD Procedure Note   DIAGNOSIS: Desires long-term, reversible contraception   PROCEDURE: IUD placement Performing Provider: Orvilla Cornwallachelle Audley Hinojos CNM  Patient counseled prior to procedure. I explained risks and benefits of Kyleena IUD, reviewed alternative forms of contraception. Patient stated understanding and consented to continue with procedure.   LMP: 07/08/15 Pregnancy Test: Negative Lot #: [id number] Expiration Date: March 2018   IUD type: [   ] Mirena   [    ] Paraguard  [   ] Skyla   [X]   Kyleena  PROCEDURE:  Timeout procedure was performed to ensure right patient and right site.  A bimanual exam was performed to determine the position of the uterus, retroverted. The speculum was placed. The vagina and cervix was sterilized in the usual manner and sterile technique was maintained throughout the course of the procedure. A single toothed tenaculum was applied to the posterior lip of the cervix and gentle traction applied. The depth of the uterus was sounded to 9 cm. With gentle traction on the tenaculum, the IUD was inserted to the appropriate depth and inserted without difficulty.  The string was cut to an estimated 4 cm length. Bleeding was minimal. The patient tolerated the procedure well.   Follow up: The patient tolerated the procedure well without complications.  Standard post-procedure care is explained and return precautions are given.  Orvilla Cornwallachelle Waverley Krempasky CNM

## 2015-07-11 ENCOUNTER — Other Ambulatory Visit: Payer: Self-pay | Admitting: Certified Nurse Midwife

## 2015-07-13 LAB — NUSWAB VG+, CANDIDA 6SP
Atopobium vaginae: HIGH Score — AB
BVAB 2: HIGH Score — AB
Candida albicans, NAA: POSITIVE — AB
Candida glabrata, NAA: NEGATIVE
Candida krusei, NAA: NEGATIVE
Candida lusitaniae, NAA: NEGATIVE
Candida parapsilosis, NAA: NEGATIVE
Candida tropicalis, NAA: NEGATIVE
Chlamydia trachomatis, NAA: NEGATIVE
Neisseria gonorrhoeae, NAA: NEGATIVE
Trich vag by NAA: NEGATIVE

## 2015-07-13 LAB — URINE CULTURE

## 2015-07-14 ENCOUNTER — Encounter: Payer: Self-pay | Admitting: *Deleted

## 2015-07-14 ENCOUNTER — Other Ambulatory Visit: Payer: Self-pay | Admitting: Certified Nurse Midwife

## 2015-07-14 DIAGNOSIS — B9689 Other specified bacterial agents as the cause of diseases classified elsewhere: Secondary | ICD-10-CM

## 2015-07-14 DIAGNOSIS — B373 Candidiasis of vulva and vagina: Secondary | ICD-10-CM

## 2015-07-14 DIAGNOSIS — N760 Acute vaginitis: Secondary | ICD-10-CM

## 2015-07-14 DIAGNOSIS — B3731 Acute candidiasis of vulva and vagina: Secondary | ICD-10-CM

## 2015-07-14 MED ORDER — FLUCONAZOLE 100 MG PO TABS: 100.0000 mg | ORAL_TABLET | Freq: Once | ORAL | Status: DC

## 2015-07-14 MED ORDER — TERCONAZOLE 0.8 % VA CREA: 1.0000 | TOPICAL_CREAM | Freq: Every day | VAGINAL | Status: DC

## 2015-07-14 MED ORDER — METRONIDAZOLE 500 MG PO TABS: 500.0000 mg | ORAL_TABLET | Freq: Two times a day (BID) | ORAL | Status: DC

## 2015-08-06 ENCOUNTER — Ambulatory Visit (INDEPENDENT_AMBULATORY_CARE_PROVIDER_SITE_OTHER): Payer: BLUE CROSS/BLUE SHIELD | Admitting: Certified Nurse Midwife

## 2015-08-06 ENCOUNTER — Encounter: Payer: Self-pay | Admitting: Certified Nurse Midwife

## 2015-08-06 VITALS — BP 136/81 | HR 86 | Ht 64.0 in | Wt 246.0 lb

## 2015-08-06 DIAGNOSIS — Z01419 Encounter for gynecological examination (general) (routine) without abnormal findings: Secondary | ICD-10-CM | POA: Diagnosis not present

## 2015-08-06 DIAGNOSIS — Z113 Encounter for screening for infections with a predominantly sexual mode of transmission: Secondary | ICD-10-CM

## 2015-08-06 NOTE — Progress Notes (Signed)
Patient ID: April [name], female   DOB: 1991-01-27, 25 y.o.   MRN: [id number]    Subjective:      April [name] is a 25 y.o. female here for a routine exam.  Current complaints: none.  Is happy with her IUD.  Had some spotting, nothing since yesterday.    Desires full STD screening.  Reports occasional periods with IUD.    Personal health questionnaire:  Is patient Ashkenazi Jewish, have a family history of breast and/or ovarian cancer: Paternal aunt BCA, treated and doing well now.  Is there a family history of uterine cancer diagnosed at age < 26, gastrointestinal cancer, urinary tract cancer, family member who is a Personnel officer syndrome-associated carrier: no Is the patient overweight and hypertensive, family history of diabetes, personal history of gestational diabetes, preeclampsia or PCOS: yes Is patient over 82, have PCOS,  family history of premature CHD under age 38, diabetes, smoke, have hypertension or peripheral artery disease:  yes At any time, has a partner hit, kicked or otherwise hurt or frightened you?: no Over the past 2 weeks, have you felt down, depressed or hopeless?: no Over the past 2 weeks, have you felt little interest or pleasure in doing things?:no   Gynecologic History Patient's last menstrual period was 07/08/2015. Contraception: IUD Last Pap: unknown. Results were: normal according to the patient Last mammogram: N/A.   Obstetric History OB History  Gravida Para Term Preterm AB SAB TAB Ectopic Multiple Living         Past Medical History  Diagnosis Date  . Medical history non-contributory     Past Surgical History  Procedure Laterality Date  . No past surgeries       Current outpatient prescriptions:  .  cholecalciferol (VITAMIN D) 1000 units tablet, Take 1,000 Units by mouth daily., Disp: , Rfl:  .  ferrous sulfate 325 (65 FE) MG tablet, Take 325 mg by mouth daily with breakfast., Disp: , Rfl:  .  ibuprofen (ADVIL,MOTRIN)  800 MG tablet, Take 1 tablet (800 mg total) by mouth every 8 (eight) hours as needed. (Patient not taking: Reported on 07/09/2015), Disp: 30 tablet, Rfl: 5 .  levonorgestrel-ethinyl estradiol (NORDETTE) 0.15-30 MG-MCG tablet, Take 1 tablet by mouth daily., Disp: 1 Package, Rfl: 11 No Known Allergies  Social History  Substance Use Topics  . Smoking status: Never Smoker   . Smokeless tobacco: Never Used  . Alcohol Use: 0.0 oz/week    0 Standard drinks or equivalent per week     Comment: social    Family History  Problem Relation Age of Onset  . Diabetes Father   . Heart disease Maternal Grandmother   . Lung cancer Paternal Grandfather     smoker      Review of Systems  Constitutional: negative for fatigue and weight loss Respiratory: negative for cough and wheezing Cardiovascular: negative for chest pain, fatigue and palpitations Gastrointestinal: negative for abdominal pain and change in bowel habits Musculoskeletal:negative for myalgias Neurological: negative for gait problems and tremors Behavioral/Psych: negative for abusive relationship, depression Endocrine: negative for temperature intolerance   Genitourinary:negative for abnormal menstrual periods, genital lesions, hot flashes, sexual problems and vaginal discharge Integument/breast: negative for breast lump, breast tenderness, nipple discharge and skin lesion(s)    Objective:       BP 136/81 mmHg  Pulse 86  Ht  (1.626 m)  Wt 246 lb (111.585 kg)  BMI 42.21 kg/m2  LMP 07/08/2015 General:   alert  Skin:   no rash or abnormalities  Lungs:   clear to auscultation bilaterally  Heart:   regular rate and rhythm, S1, S2 normal, no murmur, click, rub or gallop  Breasts:   normal without suspicious masses, skin or nipple changes or axillary nodes  Abdomen:  normal findings: no organomegaly, soft, non-tender and no hernia  Pelvis:  External genitalia: normal general appearance Urinary system: urethral meatus normal  and bladder without fullness, nontender Vaginal: normal without tenderness, induration or masses Cervix: normal appearance, +IUD strings Adnexa: normal bimanual exam Uterus: anteverted and non-tender, normal size   Lab Review Urine pregnancy test Labs reviewed yes Radiologic studies reviewed no  50% of 30 min visit spent on counseling and coordination of care.   Assessment:    Healthy female exam.   STD screening exam   Plan:    Education reviewed: calcium supplements, depression evaluation, low fat, low cholesterol diet, safe sex/STD prevention, self breast exams, skin cancer screening and weight bearing exercise. Contraception: IUD. Follow up in: 1 year.   No orders of the defined types were placed in this encounter.   Orders Placed This Encounter  Procedures  . Hepatitis B surface antigen  . RPR  . Hepatitis C antibody  . HIV antibody  . Hemoglobin A1c  . CBC with Differential/Platelet  . Comprehensive metabolic panel  . TSH

## 2015-08-07 LAB — COMPREHENSIVE METABOLIC PANEL
ALT: 11 IU/L (ref 0–32)
AST: 13 IU/L (ref 0–40)
Albumin/Globulin Ratio: 1.5 (ref 1.2–2.2)
Albumin: 4.3 g/dL (ref 3.5–5.5)
Alkaline Phosphatase: 87 IU/L (ref 39–117)
BUN/Creatinine Ratio: 11 (ref 9–23)
BUN: 8 mg/dL (ref 6–20)
Bilirubin Total: 0.3 mg/dL (ref 0.0–1.2)
CO2: 18 mmol/L (ref 18–29)
Calcium: 9.2 mg/dL (ref 8.7–10.2)
Chloride: 106 mmol/L (ref 96–106)
Creatinine, Ser: 0.7 mg/dL (ref 0.57–1.00)
GFR calc Af Amer: 139 mL/min/{1.73_m2} (ref >59)
GFR calc non Af Amer: 121 mL/min/{1.73_m2} (ref >59)
Globulin, Total: 2.9 g/dL (ref 1.5–4.5)
Glucose: 82 mg/dL (ref 65–99)
Potassium: 4 mmol/L (ref 3.5–5.2)
Sodium: 142 mmol/L (ref 134–144)
Total Protein: 7.2 g/dL (ref 6.0–8.5)

## 2015-08-07 LAB — CBC WITH DIFFERENTIAL/PLATELET
Basophils Absolute: 0 10*3/uL (ref 0.0–0.2)
Basos: 0 %
EOS (ABSOLUTE): 0.2 10*3/uL (ref 0.0–0.4)
Eos: 4 %
Hematocrit: 38.1 % (ref 34.0–46.6)
Hemoglobin: 12.2 g/dL (ref 11.1–15.9)
Immature Grans (Abs): 0 10*3/uL (ref 0.0–0.1)
Immature Granulocytes: 0 %
Lymphocytes Absolute: 2 10*3/uL (ref 0.7–3.1)
Lymphs: 43 %
MCH: 25.9 pg — ABNORMAL LOW (ref 26.6–33.0)
MCHC: 32 g/dL (ref 31.5–35.7)
MCV: 81 fL (ref 79–97)
Monocytes Absolute: 0.4 10*3/uL (ref 0.1–0.9)
Monocytes: 8 %
Neutrophils Absolute: 2.1 10*3/uL (ref 1.4–7.0)
Neutrophils: 45 %
Platelets: 337 10*3/uL (ref 150–379)
RBC: 4.71 x10E6/uL (ref 3.77–5.28)
RDW: 19 % — ABNORMAL HIGH (ref 12.3–15.4)
WBC: 4.6 10*3/uL (ref 3.4–10.8)

## 2015-08-07 LAB — HEPATITIS B SURFACE ANTIGEN: Hepatitis B Surface Ag: NEGATIVE

## 2015-08-07 LAB — PAP IG W/ RFLX HPV ASCU: PAP Smear Comment: 0

## 2015-08-07 LAB — HEMOGLOBIN A1C
Est. average glucose Bld gHb Est-mCnc: 103 mg/dL
Hgb A1c MFr Bld: 5.2 % (ref 4.8–5.6)

## 2015-08-07 LAB — HEPATITIS C ANTIBODY: Hep C Virus Ab: <0.1 s/co ratio (ref 0.0–0.9)

## 2015-08-07 LAB — RPR: RPR Ser Ql: NONREACTIVE

## 2015-08-07 LAB — TSH: TSH: 3.5 u[IU]/mL (ref 0.450–4.500)

## 2015-08-07 LAB — HIV ANTIBODY (ROUTINE TESTING W REFLEX): HIV Screen 4th Generation wRfx: NONREACTIVE

## 2015-08-11 LAB — NUSWAB VG+, CANDIDA 6SP
Candida albicans, NAA: NEGATIVE
Candida glabrata, NAA: NEGATIVE
Candida krusei, NAA: NEGATIVE
Candida lusitaniae, NAA: NEGATIVE
Candida parapsilosis, NAA: NEGATIVE
Candida tropicalis, NAA: NEGATIVE
Chlamydia trachomatis, NAA: NEGATIVE
Neisseria gonorrhoeae, NAA: NEGATIVE
Trich vag by NAA: NEGATIVE

## 2015-10-11 ENCOUNTER — Ambulatory Visit (INDEPENDENT_AMBULATORY_CARE_PROVIDER_SITE_OTHER): Payer: BLUE CROSS/BLUE SHIELD | Admitting: Family Medicine

## 2015-10-11 ENCOUNTER — Encounter: Payer: Self-pay | Admitting: Family Medicine

## 2015-10-11 VITALS — BP 122/72 | HR 104 | Temp 98.5°F | Resp 17 | Ht 65.5 in | Wt 249.0 lb

## 2015-10-11 DIAGNOSIS — R062 Wheezing: Secondary | ICD-10-CM

## 2015-10-11 MED ORDER — AZITHROMYCIN 250 MG PO TABS: ORAL_TABLET | ORAL | 0 refills | Status: DC

## 2015-10-11 MED ORDER — ALBUTEROL SULFATE (2.5 MG/3ML) 0.083% IN NEBU
2.5000 mg | INHALATION_SOLUTION | Freq: Once | RESPIRATORY_TRACT | Status: AC
  Administered 2015-10-11: 2.5 mg via RESPIRATORY_TRACT

## 2015-10-11 MED ORDER — ALBUTEROL SULFATE HFA 108 (90 BASE) MCG/ACT IN AERS: 2.0000 | INHALATION_SPRAY | Freq: Four times a day (QID) | RESPIRATORY_TRACT | 0 refills | Status: DC | PRN

## 2015-10-11 NOTE — Patient Instructions (Addendum)
Use the inhaler as needed.    Take the Zpak as an antibiotic.    If you're not feeling better despite the medicine, come back and see us.  If you're still needing the inhaler in the next couple of weeks even when you're feeling better, go to your PCP to ask about being evaluated for asthma.      IF you received an x-ray today, you will receive an invoice from [facility]. Please contact [facility] at [phone] with questions or concerns regarding your invoice.   IF you received labwork today, you will receive an invoice from [facility]. Please contact [facility] at [phone] with questions or concerns regarding your invoice.   Our billing staff will not be able to assist you with questions regarding bills from these companies.  You will be contacted with the lab results as soon as they are available. The fastest way to get your results is to activate your My Chart account. Instructions are located on the last page of this paperwork. If you have not heard from us regarding the results in 2 weeks, please contact this office.

## 2015-10-11 NOTE — Progress Notes (Signed)
April [name] is a 25 y.o. female who presents to Urgent Care today for cough and URI symptoms  1.  Cough and URI:  Symptoms started earlier this week.  Has been worsening as week progressed.  Now with cough for past several days, alternative productive with dry hacking.  Some subjective fevers and chills.Cough awakens her at night.  No N/V.     ROS as above.    PMH reviewed. Patient is a nonsmoker.   Past Medical History:  Diagnosis Date  . Medical history non-contributory    Past Surgical History:  Procedure Laterality Date  . NO PAST SURGERIES      Medications reviewed. Current Outpatient Prescriptions  Medication Sig Dispense Refill  . cholecalciferol (VITAMIN D) 1000 units tablet Take 1,000 Units by mouth daily.    Marland Kitchen. ibuprofen (ADVIL,MOTRIN) 800 MG tablet Take 1 tablet (800 mg total) by mouth every 8 (eight) hours as needed. 30 tablet 5  . ferrous sulfate 325 (65 FE) MG tablet Take 325 mg by mouth daily with breakfast.    . levonorgestrel-ethinyl estradiol (NORDETTE) 0.15-30 MG-MCG tablet Take 1 tablet by mouth daily. (Patient not taking: Reported on 10/11/2015) 1 Package 11   No current facility-administered medications for this visit.      Physical Exam:  BP 122/72 (BP Location: Right Arm, Patient Position: Sitting, Cuff Size: Normal)   Pulse (!) 104   Temp 98.5 F (36.9 C) (Oral)   Resp 17   Ht 5' 5.5" (1.664 m)   Wt 249 lb (112.9 kg)   LMP 10/03/2015 (Approximate)   SpO2 98%   BMI 40.81 kg/m  Gen:  Alert, cooperative patient who appears stated age in no acute distress.  Vital signs reviewed. HEENT: EOMI,  MMM Pulm:  Wheezes BL bases.   Cardiac:  Regular rate and rhythm  Exts: Non edematous BL  LE, warm and well perfused.   Assessment and Plan:  1.  URI with reactive airway disease: - no diagnosis of asthma. - treat with albuterol symptomatically.  Given nebulized treatment here, better afterwards.  MInimal persistent wheezing.  - zpak for LRTI.  - FU  with PCP in next 2-3 weeks to evaluate for asthma if still needs inhaler at that time.

## 2017-01-11 ENCOUNTER — Ambulatory Visit (INDEPENDENT_AMBULATORY_CARE_PROVIDER_SITE_OTHER): Payer: BLUE CROSS/BLUE SHIELD | Admitting: Obstetrics

## 2017-01-11 ENCOUNTER — Encounter: Payer: Self-pay | Admitting: Obstetrics

## 2017-01-11 VITALS — BP 132/84 | HR 96 | Temp 98.7°F | Resp 16 | Wt 264.2 lb

## 2017-01-11 DIAGNOSIS — Z113 Encounter for screening for infections with a predominantly sexual mode of transmission: Secondary | ICD-10-CM | POA: Diagnosis not present

## 2017-01-11 DIAGNOSIS — N898 Other specified noninflammatory disorders of vagina: Secondary | ICD-10-CM

## 2017-01-11 DIAGNOSIS — N921 Excessive and frequent menstruation with irregular cycle: Secondary | ICD-10-CM

## 2017-01-11 DIAGNOSIS — Z975 Presence of (intrauterine) contraceptive device: Principal | ICD-10-CM

## 2017-01-11 DIAGNOSIS — Z3202 Encounter for pregnancy test, result negative: Secondary | ICD-10-CM

## 2017-01-11 DIAGNOSIS — Z3200 Encounter for pregnancy test, result unknown: Secondary | ICD-10-CM

## 2017-01-11 LAB — POCT URINE PREGNANCY: Preg Test, Ur: NEGATIVE

## 2017-01-11 NOTE — Progress Notes (Signed)
Subjective:    April [name] is a 26 y.o. female who presents for contraception counseling. The patient has no complaints today. The patient is sexually active. Pertinent past medical history: none.  The information documented in the HPI was reviewed and verified.  Menstrual History: OB History    Gravida Para Term Preterm AB Living   0 0 0 0 0 0   SAB TAB Ectopic Multiple Live Births   0 0 0 0         Patient's last menstrual period was 12/28/2016 (exact date).   Patient Active Problem List   Diagnosis Date Noted  . Papanicolaou smear of cervix with atypical squamous cells of undetermined significance (ASC-US) 06/26/2012  . Dysmenorrhea 06/26/2012   Past Medical History:  Diagnosis Date  . Medical history non-contributory     Past Surgical History:  Procedure Laterality Date  . NO PAST SURGERIES       Current Outpatient Medications:  .  albuterol (PROVENTIL HFA;VENTOLIN HFA) 108 (90 Base) MCG/ACT inhaler, Inhale 2 puffs into the lungs every 6 (six) hours as needed for wheezing or shortness of breath., Disp: 1 Inhaler, Rfl: 0 .  ibuprofen (ADVIL,MOTRIN) 800 MG tablet, Take 1 tablet (800 mg total) by mouth every 8 (eight) hours as needed., Disp: 30 tablet, Rfl: 5 .  Levonorgestrel (KYLEENA) 19.5 MG IUD, by Intrauterine route., Disp: , Rfl:  No Known Allergies  Social History   Tobacco Use  . Smoking status: Never Smoker  . Smokeless tobacco: Never Used  Substance Use Topics  . Alcohol use: Yes    Alcohol/week: 0.0 oz    Comment: social    Family History  Problem Relation Age of Onset  . Diabetes Father   . Heart disease Maternal Grandmother   . Lung cancer Paternal Grandfather        smoker       Review of Systems Constitutional: negative for weight loss Genitourinary:positive for AUB and increased vaginal discharge   Objective:    BP 132/84 (BP Location: Right Arm, Patient Position: Sitting, Cuff Size: Large)   Pulse 96   Temp 98.7 F (37.1 C)  (Oral)   Resp 16   Wt 264 lb 3.2 oz (119.8 kg)   LMP 12/28/2016 (Exact Date)   BMI 43.30 kg/m           General:  Alert and no distress Abdomen:  normal findings: no organomegaly, soft, non-tender and no hernia  Pelvis:  External genitalia: normal general appearance Urinary system: urethral meatus normal and bladder without fullness, nontender Vaginal: normal without tenderness, induration or masses Cervix: normal appearance.  IUD string visible and normal length.  Adnexa: normal bimanual exam Uterus: anteverted and non-tender, normal size   Lab Review Urine pregnancy test Labs reviewed yes Radiologic studies reviewed no  50% of 15 min visit spent on counseling and coordination of care.    Assessment:    26 y.o., continuing IUD, no contraindications.   Plan:    Wet prep and GC / Chlamydia cultures done  Ultrasound ordered for IUD placement  Follow up in 2 weeks  Meds ordered this encounter  Medications  . Levonorgestrel (KYLEENA) 19.5 MG IUD    Sig: by Intrauterine route.   Orders Placed This Encounter  Procedures  . US PELVIC COMPLETE WITH TRANSVAGINAL    Wt 264 No needs  Ins bcbs   fs  Anitra @ [facility] to fax order      Standing Status:   Future  Standing Expiration Date:   03/13/2018    Order Specific Question:   Reason for Exam (SYMPTOM  OR DIAGNOSIS REQUIRED)    Answer:   AUB with IUD    Order Specific Question:   Preferred imaging location?    Answer[facility]  . POCT urine pregnancy

## 2017-01-12 LAB — CERVICOVAGINAL ANCILLARY ONLY
Bacterial vaginitis: NEGATIVE
Candida vaginitis: NEGATIVE
Chlamydia: NEGATIVE
Neisseria Gonorrhea: NEGATIVE
Trichomonas: NEGATIVE

## 2017-01-17 ENCOUNTER — Other Ambulatory Visit: Payer: BLUE CROSS/BLUE SHIELD

## 2017-01-25 ENCOUNTER — Ambulatory Visit
Admission: RE | Admit: 2017-01-25 | Discharge: 2017-01-25 | Disposition: A | Discharge status: Home / Self Care | Payer: BLUE CROSS/BLUE SHIELD | Source: Ambulatory Visit | Attending: Obstetrics | Admitting: Obstetrics

## 2017-01-25 DIAGNOSIS — N921 Excessive and frequent menstruation with irregular cycle: Secondary | ICD-10-CM

## 2017-01-25 DIAGNOSIS — Z975 Presence of (intrauterine) contraceptive device: Principal | ICD-10-CM

## 2018-01-18 DIAGNOSIS — E559 Vitamin D deficiency, unspecified: Secondary | ICD-10-CM | POA: Insufficient documentation

## 2018-08-08 ENCOUNTER — Encounter: Payer: Self-pay | Admitting: Sports Medicine

## 2018-08-08 ENCOUNTER — Other Ambulatory Visit: Payer: Self-pay

## 2018-08-08 ENCOUNTER — Ambulatory Visit (INDEPENDENT_AMBULATORY_CARE_PROVIDER_SITE_OTHER): Payer: BLUE CROSS/BLUE SHIELD | Admitting: Sports Medicine

## 2018-08-08 VITALS — BP 101/69 | HR 100 | Temp 97.7°F | Resp 16

## 2018-08-08 DIAGNOSIS — M79674 Pain in right toe(s): Secondary | ICD-10-CM

## 2018-08-08 DIAGNOSIS — M79675 Pain in left toe(s): Secondary | ICD-10-CM

## 2018-08-08 DIAGNOSIS — B351 Tinea unguium: Secondary | ICD-10-CM

## 2018-08-08 NOTE — Progress Notes (Signed)
Subjective: April [name] is a 28 y.o. female patient seen today in office with complaint of mildly painful thickened and discolored nails. Patient is desiring treatment for nail changes; has not tried OTC topicals/Medication in the past. Reports that nails are becoming difficult to manage because of the thickness at her baby toes. Patient has no other pedal complaints at this time.   Review of Systems  All other systems reviewed and are negative.    Patient Active Problem List   Diagnosis Date Noted  . Vitamin D deficiency 01/18/2018  . Papanicolaou smear of cervix with atypical squamous cells of undetermined significance (ASC-US) 06/26/2012  . Dysmenorrhea 06/26/2012    Current Outpatient Medications on File Prior to Visit  Medication Sig Dispense Refill  . albuterol (PROVENTIL HFA;VENTOLIN HFA) 108 (90 Base) MCG/ACT inhaler Inhale 2 puffs into the lungs every 6 (six) hours as needed for wheezing or shortness of breath. 1 Inhaler 0  . ibuprofen (ADVIL,MOTRIN) 800 MG tablet Take 1 tablet (800 mg total) by mouth every 8 (eight) hours as needed. 30 tablet 5  . Levonorgestrel (KYLEENA) 19.5 MG IUD by Intrauterine route.     No current facility-administered medications on file prior to visit.     No Known Allergies  Objective: Physical Exam  General: Well developed, nourished, no acute distress, awake, alert and oriented x 3  Vascular: Dorsalis pedis artery 2/4 bilateral, Posterior tibial artery 2/4 bilateral, skin temperature warm to warm proximal to distal bilateral lower extremities, no varicosities, pedal hair present bilateral.  Neurological: Gross sensation present via light touch bilateral.   Dermatological: Skin is warm, dry, and supple bilateral, Nails 1-10 are tender, short thick, and discolored with mild subungal debris with bilateral 5th toenails most involved, no webspace macerations present bilateral, no open lesions present bilateral, no  callus/corns/hyperkeratotic tissue present bilateral. No signs of infection bilateral.  Musculoskeletal: Pes planus, early bunion and varus 5th toe boney deformities noted bilateral. Muscular strength within normal limits without painon range of motion. No pain with calf compression bilateral.  Assessment and Plan:  Problem List Items Addressed This Visit    None    Visit Diagnoses    Nail fungus    -  Primary   Relevant Orders   Culture, fungus without smear   Toe pain, bilateral          -Examined patient -Discussed treatment options for painful dystrophic nails bilateral 5th toes  -Fungal culture was obtained by removing a portion of the hard nail itself from each of the involved toenails using a sterile nail nipper and sent to [facility] lab. Patient tolerated the biopsy procedure well without discomfort or need for anesthesia.  -Patient to return in 4 weeks for follow up evaluation and discussion of fungal culture results or sooner if symptoms worsen.  Landis Martins, DPM

## 2018-08-29 ENCOUNTER — Other Ambulatory Visit: Payer: Self-pay

## 2018-08-29 ENCOUNTER — Ambulatory Visit (INDEPENDENT_AMBULATORY_CARE_PROVIDER_SITE_OTHER): Payer: BLUE CROSS/BLUE SHIELD | Admitting: Sports Medicine

## 2018-08-29 ENCOUNTER — Encounter: Payer: Self-pay | Admitting: Sports Medicine

## 2018-08-29 VITALS — Temp 98.0°F

## 2018-08-29 DIAGNOSIS — M79675 Pain in left toe(s): Secondary | ICD-10-CM | POA: Diagnosis not present

## 2018-08-29 DIAGNOSIS — M79674 Pain in right toe(s): Secondary | ICD-10-CM | POA: Diagnosis not present

## 2018-08-29 DIAGNOSIS — B351 Tinea unguium: Secondary | ICD-10-CM | POA: Diagnosis not present

## 2018-08-29 NOTE — Progress Notes (Signed)
Subjective: April [name] is a 28 y.o. female patient seen today in office for fungal culture results.  Patient denies any changes with meds or problems since last visit.  Patient has no other pedal complaints at this time.   Patient Active Problem List   Diagnosis Date Noted  . Vitamin D deficiency 01/18/2018  . Papanicolaou smear of cervix with atypical squamous cells of undetermined significance (ASC-US) 06/26/2012  . Dysmenorrhea 06/26/2012    Current Outpatient Medications on File Prior to Visit  Medication Sig Dispense Refill  . albuterol (PROVENTIL HFA;VENTOLIN HFA) 108 (90 Base) MCG/ACT inhaler Inhale 2 puffs into the lungs every 6 (six) hours as needed for wheezing or shortness of breath. 1 Inhaler 0  . ibuprofen (ADVIL,MOTRIN) 800 MG tablet Take 1 tablet (800 mg total) by mouth every 8 (eight) hours as needed. 30 tablet 5  . Levonorgestrel (KYLEENA) 19.5 MG IUD by Intrauterine route.     No current facility-administered medications on file prior to visit.     No Known Allergies  Objective: Physical Exam  General: Well developed, nourished, no acute distress, awake, alert and oriented x 3  Vascular: Dorsalis pedis artery 2/4 bilateral, Posterior tibial artery 2/4 bilateral, skin temperature warm to warm proximal to distal bilateral lower extremities, no varicosities, pedal hair present bilateral.  Neurological: Gross sensation present via light touch bilateral.   Dermatological: Skin is warm, dry, and supple bilateral, bilateral fifth toes short and thick however all other nails are within normal limits no webspace macerations present bilateral, no open lesions present bilateral, no callus/corns/hyperkeratotic tissue present bilateral. No signs of infection bilateral.  Musculoskeletal: Asymptomatic mild hammertoe noted bilateral. Muscular strength within normal limits without painon range of motion. No pain with calf compression bilateral.  Fungal culture negative  supportive nail splitting secondary to dehydration  Assessment and Plan:  Problem List Items Addressed This Visit    None    Visit Diagnoses    Nail fungus    -  Primary   Toe pain, bilateral          -Examined patient -Discussed treatment options for dystrophic nails -Medical pathology reviewed -Advised patient to start a biotin supplement and a urea-based emollient to her nails as directed -Advised good hygiene habits -Patient to return as needed or sooner if symptoms worsen.  Landis Martins, DPM

## 2018-10-31 HISTORY — PX: WISDOM TOOTH EXTRACTION: SHX21

## 2019-02-07 ENCOUNTER — Encounter: Payer: Self-pay | Admitting: Obstetrics and Gynecology

## 2019-02-07 ENCOUNTER — Other Ambulatory Visit: Payer: Self-pay

## 2019-02-07 ENCOUNTER — Ambulatory Visit (INDEPENDENT_AMBULATORY_CARE_PROVIDER_SITE_OTHER): Payer: BC Managed Care – PPO | Admitting: Obstetrics and Gynecology

## 2019-02-07 VITALS — BP 128/82 | HR 81 | Ht 64.0 in | Wt 286.2 lb

## 2019-02-07 DIAGNOSIS — N898 Other specified noninflammatory disorders of vagina: Secondary | ICD-10-CM | POA: Diagnosis not present

## 2019-02-07 DIAGNOSIS — Z124 Encounter for screening for malignant neoplasm of cervix: Secondary | ICD-10-CM | POA: Diagnosis not present

## 2019-02-07 DIAGNOSIS — Z01419 Encounter for gynecological examination (general) (routine) without abnormal findings: Secondary | ICD-10-CM

## 2019-02-07 DIAGNOSIS — Z30432 Encounter for removal of intrauterine contraceptive device: Secondary | ICD-10-CM

## 2019-02-07 DIAGNOSIS — Z113 Encounter for screening for infections with a predominantly sexual mode of transmission: Secondary | ICD-10-CM | POA: Diagnosis not present

## 2019-02-07 DIAGNOSIS — Z1151 Encounter for screening for human papillomavirus (HPV): Secondary | ICD-10-CM

## 2019-02-07 DIAGNOSIS — B373 Candidiasis of vulva and vagina: Secondary | ICD-10-CM | POA: Diagnosis not present

## 2019-02-07 NOTE — Progress Notes (Signed)
Pt is in the office for annual and IUD removal, inserted May 2017.  Pt desires to get pregnant and does not like IUD. Last pap 08-06-2015.

## 2019-02-07 NOTE — Patient Instructions (Signed)
Fish Oil, Omega-3 Fatty Acids capsules (OTC) What is this medicine? FISH OIL, OMEGA-3 FATTY ACIDS (Fish Oil, oh MAY ga - 3 fatty AS ids) are essential fats. It is promoted to help support a healthy heart. This dietary supplement is used to add to a healthy diet. The FDA has not approved this supplement for any medical use. This supplement may be used for other purposes; ask your health care provider or pharmacist if you have questions. This medicine may be used for other purposes; ask your health care provider or pharmacist if you have questions. COMMON BRAND NAME(S): Omega-3, OMEGA-3 IQ DHA, TherOmega, THEROMEGA SPORT What should I tell my health care provider before I take this medicine? They need to know if you have any of these conditions  bleeding problems  lung or breathing disease, like asthma  an unusual or allergic reaction to fish oil, omega-3 fatty acids, fish, other medicines, foods, dyes, or preservatives  pregnant or trying to get pregnant  breast-feeding How should I use this medicine? Take this medicine by mouth with a glass of water. Follow the directions on the package or prescription label. Take with food. Take your medicine at regular intervals. Do not take your medicine more often than directed. Talk to your pediatrician regarding the use of this medicine in children. Special care may be needed. This medicine should not be used in children without a doctor's advice. Overdosage: If you think you have taken too much of this medicine contact a poison control center or emergency room at once. NOTE: This medicine is only for you. Do not share this medicine with others. What if I miss a dose? If you miss a dose, take it as soon as you can. If it is almost time for your next dose, take only that dose. Do not take double or extra doses. What may interact with this medicine?  aspirin and aspirin-like medicines  herbal products like danshen, dong quai, garlic pills, ginger,  ginkgo biloba, horse chestnut, willow bark, and others  medicines that treat or prevent blood clots like enoxaparin, heparin, warfarin This list may not describe all possible interactions. Give your health care provider a list of all the medicines, herbs, non-prescription drugs, or dietary supplements you use. Also tell them if you smoke, drink alcohol, or use illegal drugs. Some items may interact with your medicine. What should I watch for while using this medicine? Follow a good diet and exercise plan. Taking a dietary supplement does not replace a healthy lifestyle. Some foods that have omega-3 fatty acids naturally are fatty fish like albacore tuna, halibut, herring, mackerel, lake trout, salmon, and sardines. Too much of this supplement can be unsafe. Talk to your doctor or health care provider about how much of this supplement is right for you. If you are scheduled for any medical or dental procedure, tell your healthcare provider that you are taking this medicine. You may need to stop taking this medicine before the procedure. Herbal or dietary supplements are not regulated like medicines. Rigid quality control standards are not required for dietary supplements. The purity and strength of these products can vary. The safety and effect of this dietary supplement for a certain disease or illness is not well known. This product is not intended to diagnose, treat, cure or prevent any disease. The Food and Drug Administration suggests the following to help consumers protect themselves:  Always read product labels and follow directions.  Natural does not mean a product is safe for humans  to take.  Look for products that include USP after the ingredient name. This means that the manufacturer followed the standards of the [place] Pharmacopoeia.  Supplements made or sold by a nationally known food or drug company are more likely to be made under tight controls. You can write to the company for more  information about how the product was made. What side effects may I notice from receiving this medicine? Side effects that you should report to your doctor or health care professional as soon as possible:  allergic reactions like skin rash, itching or hives, swelling of the face, lips, or tongue  breathing problems  changes in your moods or emotions  unusual bleeding or bruising Side effects that usually do not require medical attention (report to your doctor or health care professional if they continue or are bothersome):  bad or fishy breath  belching  diarrhea  nausea  stomach gas, upset  weight gain This list may not describe all possible side effects. Call your doctor for medical advice about side effects. You may report side effects to FDA at 1-800-FDA-1088. Where should I keep my medicine? Keep out of the reach of children. Store at room temperature or as directed on the package label. Protect from moisture. Do not freeze. Throw away any unused medicine after the expiration date. NOTE: This sheet is a summary. It may not cover all possible information. If you have questions about this medicine, talk to your doctor, pharmacist, or health care provider.  2020 Elsevier/Gold Standard (2014-06-06 09:36:32) Prenatal Vitamin and Mineral Combinations (oral solid dosage forms) What is this medicine? PRENATAL VITAMIN AND MINERAL combinations are used before, during, and after pregnancy to help provide provide good nutrition. This medicine may be used for other purposes; ask your health care provider or pharmacist if you have questions. COMMON BRAND NAME(S): Advanced Care Plus, Advanced NatalCare, Advanced-RF NatalCare, BP FoliNatal Plus B, BP MultiNatal Plus, BP MultiNatal Plus Chewable, BP Prenate, Calcium PNV, CareNatal DHA, CareNate 600, Cavan One Omega, Cavan Prenatal with EC Calcium, Cavan-Alpha, Cavan-EC SOD DHA, Cavan-Heme OB, Cavan-Heme Omega, Centrum Specialist Prenatal,  CertaVite with Antioxidants, Choice-OB + DHA, Citracal Prenatal, Citracal Prenatal + DHA, CitraNatal 90 DHA, CitraNatal Assure, CitraNatal DHA, CitraNatal Harmony, ComBi Rx, Complete Natal DHA, Complete-RF, CompleteNate, CoreNate-DHA, CRNatal DHA, Docosavit, Duet, Duet Chewable, Duet DHA, Duet DHA 400, Duet DHA 430ec, Duet DHA Balanced, Duet DHA Complete, Duet DHA EC, Duet DHA Ferrazone, EC Omega-3, DuoVit DHA, Edge OB, Elite OB with DHA, Elite-OB 400, Femecal OB, Femecal OB Plus DHA, Focalgin 90 DHA, Focalgin CA, Folbecal, Folcaps Care One, Folcaps Omega 3, Folivane-EC Calcium DHA NF, Foltabs 90 Plus DHA, Foltabs Prenatal, Foltabs Prenatal Plus DHA, Gesticare, Gesticare DHA, Gesticare DHA Delayed-Release, HemeNatal OB + DHA, HIP Prenatal, iNatal Advance, Infanate Balance, Infanate DHA, Kolnatal, Lactocal-F, Levomefolate PNV, Marnatal-F Plus, Maxinate, Mom's Choice Rx, Multi-Nate 30, Multi-Nate 30 DHA, Multi-Nate DHA Extra, Multifol Plus, Multivitamin With Minerals, NataChew, NataFolic-OB, Natal-V RX, NatalCare PIC Forte, NatalCare Plus, NATALVIRT 90 DHA, NATALVIRT CA, Natatab Rx, Natelle C, Natelle One, Natelle Plus with DHA, Navatab + DHA, Neevo, Nestabs ABC, Nestabs DHA, Niferex-PN Forte, Niva-Plus, NovaNatal, Nu-Natal, Nutri-Tab OB + DHA, Nutrinate, OB Choice, OB Complete 400, OB Complete Premier, OB Complete with DHA, OB-Natal One, Obtrex DHA, One-A-Day Women's, One-A-Day Women's Prenatal, OptiNate, Paire OB Tablet Plus DHA, PNV OB + DHA, PNV Prenatal, PNV Prenatal Plus Multivitamin, PNV-DHA, PNV-DHA Plus, PNV-Iron, PNV-OB with DHA, PNV-Select, PNV-Total with DHA, PR [place] 400, PR [place] 400ec, [place]  430, PR [address] 430ec, PR [place]atal 440ec, PreCare, PreferaOB, PreferaOB + DHA, Premesis Rx, Prena1 Plus, Prena1 True, Prenaissance 90 DHA, Prenaissance DHA, Prenaissance Harmony DHA, Prenaissance Promise, PrenaPlus, Prenatabs OBN, Prenatabs RX, Prenatal 1 Plus 1, Prenatal AD, Prenatal Low Iron, Prenatal Multivitamin +  DHA 2, Prenatal Plus, Prenatal Plus Iron, Prenatal Plus Low Iron, Prenatal Vitamin, PreNatal Vitamins Plus, Prenate Advance, Prenate Elite, PreNate Plus, PrePLUS, Previte Rx, PruEt DHA, PruEt DHAec, PureFe Plus, PureVit DualFe Plus, RE DualVit OB, RE DualVit Plus, RE OB + DHA, RE OB 90 + DHA, RE Prenatal, RE PreVit + DHA, RE-Nata 29, RE-Nata 29 OB, Renate, Renate DHA, Renate DHA Extra, REocyte Plus, Rovin-Nv, Rovin-Nv DHA, Se-Care, Se-Care Conceive, [address] Gesture, Se-Natal 90, Se-Natal ONE, Se-Plete DHA, Select-OB, Select-OB + DHA, SetonET, SetonET-EC DHA, Stuart Prenatal + DHA, Taron A Prenatal Pack with DHA, Taron EC Calcium DHA Pack, Taron Prenatal with DHA, Taron-EC Cal, Taron-Prex Prenatal with DHA, Thera [place]atal OvaVite, Thera NIKEatal Plus, Harvardhera-Tabs, Thrivite, TL-Care DHA, Tri Rx, Trifera OB, Trimesis Rx, TriStart DHA, Triveen-Duo DHA, Trust Harley-Davidsonatal DHA, UltimateCare Advantage, UltimateCare Combo, UltimateCare ONE, UltimateCare ONE NF, Vena-Bal DHA, Verotin-BY, Verotin-GR, Vinate C, Vinate Care, Vinate II, Vinate III, Virt-Nate, Virt-PN, vita True, Vitafol PN, Vitafol-Nano Prenatal, Vitafol-OB + DHA, Vitafol-OB and DHA, VitaMed MD Plus Rx, VitaNatal OB Plus DHA, VitaPhil, VitaPhil + DHA, VitaPhil + DHA 90, VitaPhil AiDE, Vol-Plus, Vol-Tab Rx, VP-HEME OB+ DHA, VP-PNV-DHA, Zatean-Pn, Zatean-Pn DHA What should I tell my health care provider before I take this medicine? They need to know if you have any of these conditions:  bleeding or clotting disorder  history of anemia of any type  other chronic health condition  an unusual or allergic reaction to vitamins, minerals, other medicines, foods, dyes, or preservatives How should I use this medicine? Take this medicine by mouth with a glass of water. You can take it with or without food. If it upsets your stomach, take it with food. Chewable prenatal vitamin tablets may be chewed completely before swallowing. Follow the directions on the prescription  label. The usual dose is taken once a day. Do not take your medicine more often than directed. Contact your pediatrician regarding the use of this medicine in children. Special care may be needed. This medicine is intended for females who are pregnant, breast-feeding, or may become pregnant. Overdosage: If you think you have taken too much of this medicine contact a poison control center or emergency room at once. NOTE: This medicine is only for you. Do not share this medicine with others. What if I miss a dose? If you miss a dose, take it as soon as you can. If it is almost time for your next dose, take only that dose. Do not take double or extra doses. What may interact with this medicine?  alendronate  antacids  cefdinir  cefditoren  etidronate  fluoroquinolone antibiotics (examples: ciprofloxacin, gatifloxacin, levofloxacin)  ibandronate  levodopa  risedronate  tetracycline antibiotics (examples: doxycycline, minocycline, tetracycline)  thyroid hormones  warfarin This list may not describe all possible interactions. Give your health care provider a list of all the medicines, herbs, non-prescription drugs, or dietary supplements you use. Also tell them if you smoke, drink alcohol, or use illegal drugs. Some items may interact with your medicine. What should I watch for while using this medicine? See your health care professional for regular checks on your progress. Remember that vitamin and mineral supplements do not replace the need for good nutrition from a balanced diet. Stools  commonly change color when vitamins and minerals are taken. Notify your health care professional if this change is alarming or accompanied by other symptoms, like abdominal pain. What side effects may I notice from receiving this medicine? Side effects that you should report to your doctor or health care professional as soon as possible:  allergic reaction such as skin rash or difficulty  breathing  vomiting Side effects that usually do not require medical attention (report to your doctor or health care professional if they continue or are bothersome):  nausea  stomach upset This list may not describe all possible side effects. Call your doctor for medical advice about side effects. You may report side effects to FDA at 1-800-FDA-1088. Where should I keep my medicine? Keep out of the reach of children. Most vitamins and minerals should be stored at controlled room temperature. Check your specific product directions. Protect from heat and moisture. Throw away any unused medicine after the expiration date. NOTE: This sheet is a summary. It may not cover all possible information. If you have questions about this medicine, talk to your doctor, pharmacist, or health care provider.  2020 Elsevier/Gold Standard (2017-04-05 09:17:32)

## 2019-02-07 NOTE — Progress Notes (Signed)
WELL-WOMAN PHYSICAL & PAP Patient name: April [name] MRN [id number]  Date of birth: 1990-10-29 Chief Complaint:   Gynecologic Exam  History of Present Illness:   April [name] is a 28 y.o. G0P0000 African American female being seen today for a routine well-woman exam and IUD removal.  Current complaints: Rutha Bouchard IUD removal. She would like IUD removed secondary to "don't like it and wants to conceive". She has had no issues with IUD. She understands that she could get pregnant after removal of IUD if she does not use another form of contraception. She has no other complaints today. Reviewed risks of removal including pain, bleeding, difficult removal and inability to remove IUD which may require surgical removal in OR. She affirms that she would like IUD removed.  PCP: Virl Son, MD     does not desire labs Patient's last menstrual period was 01/20/2019. The current method of family planning is IUD.  Last pap 08/06/2015. Results were: normal Last mammogram: N/A. Results were: n/a. Family h/o breast cancer: No Last colonoscopy: N/A. Results were: n/a. Family h/o colorectal cancer: No Review of Systems:   Pertinent items are noted in HPI Denies any headaches, blurred vision, fatigue, shortness of breath, chest pain, abdominal pain, abnormal vaginal discharge/itching/odor/irritation, problems with periods, bowel movements, urination, or intercourse unless otherwise stated above. Pertinent History Reviewed:  Reviewed past medical,surgical, social and family history.  Reviewed problem list, medications and allergies. Physical Assessment:   Vitals:   02/07/19 0949  BP: 128/82  Pulse: 81  Weight: 286 lb 3.2 oz (129.8 kg)  Height: 5\' 4"  (1.626 m)  Body mass index is 49.13 kg/m.        Physical Examination:   General appearance - well appearing, and in no distress  Mental status - alert, oriented to person, place, and time  Psych:  She has a normal mood and affect  Skin - warm and dry, normal color, no suspicious lesions noted  Chest - effort normal, all lung fields clear to auscultation bilaterally  Heart - normal rate and regular rhythm  Neck:  midline trachea, no thyromegaly or nodules  Breasts - breasts appear normal, no suspicious masses, no skin or nipple changes or  axillary nodes  Abdomen - soft, nontender, nondistended, no masses or organomegaly  Pelvic - VULVA: normal appearing vulva with no masses, tenderness or lesions  VAGINA: normal appearing vagina with normal color and discharge, no lesions  CERVIX: normal appearing cervix without discharge or lesions, no CMT  Thin prep pap is done with HR HPV cotesting  UTERUS: uterus is felt to be normal size, shape, consistency and nontender   ADNEXA: No adnexal masses or tenderness noted.  Rectal - deferred  Extremities:  No swelling or varicosities noted   IUD Removal Procedure  Patient with normal appearing external female genitalia. Graves speculum placed in vagina and Kyleena IUD strings easily visualized at 10:00 o'clock position. Strings grasped with ring forceps and removed easily. Minimal bleeding noted. All instruments removed from vagina. Patient tolerated procedure very well.   She was given post removal instructions. She is planning on using nothing for contraception. She plans to get pregnant.    Assessment & Plan:  1) Well woman exam with routine gynecological exam  - Cytology - PAP( [place]) - Advised to start taking PNV, Omega-3 Fatty Acids and Folic Acid supplements daily\ - Advised to incorporate a healthy lifestyle: eating right, exercising and avoiding alcohol, smoking and drug use while TTC - Call  office for (+) HPT  2) Encounter for IUD removal - As noted above  Labs/procedures today: IUD removal   Meds: No orders of the defined types were placed in this encounter.   Follow-up: Return in about 1 year (around 02/07/2020) for Annual Exam or with (+) HPT.  Laury Deep MSN, CNM 02/07/2019 10:26 AM

## 2019-02-07 NOTE — Addendum Note (Signed)
Addended by: Phillip Heal, Eziah Negro A on: 02/07/2019 02:57 PM   Modules accepted: Orders

## 2019-02-08 ENCOUNTER — Other Ambulatory Visit: Payer: Self-pay | Admitting: Obstetrics and Gynecology

## 2019-02-08 DIAGNOSIS — B3731 Acute candidiasis of vulva and vagina: Secondary | ICD-10-CM

## 2019-02-08 DIAGNOSIS — B373 Candidiasis of vulva and vagina: Secondary | ICD-10-CM

## 2019-02-08 LAB — CYTOLOGY - PAP
Comment: NEGATIVE
Diagnosis: NEGATIVE
High risk HPV: NEGATIVE

## 2019-02-08 LAB — CERVICOVAGINAL ANCILLARY ONLY
Bacterial Vaginitis (gardnerella): NEGATIVE
Candida Glabrata: NEGATIVE
Candida Vaginitis: POSITIVE — AB
Chlamydia: NEGATIVE
Comment: NEGATIVE
Comment: NEGATIVE
Comment: NEGATIVE
Comment: NEGATIVE
Comment: NEGATIVE
Comment: NORMAL
Neisseria Gonorrhea: NEGATIVE
Trichomonas: NEGATIVE

## 2019-02-08 MED ORDER — FLUCONAZOLE 150 MG PO TABS: 150.0000 mg | ORAL_TABLET | Freq: Once | ORAL | 0 refills | Status: AC

## 2019-09-24 ENCOUNTER — Other Ambulatory Visit: Payer: Self-pay

## 2019-09-24 ENCOUNTER — Ambulatory Visit (INDEPENDENT_AMBULATORY_CARE_PROVIDER_SITE_OTHER): Payer: BC Managed Care – PPO

## 2019-09-24 DIAGNOSIS — Z3201 Encounter for pregnancy test, result positive: Secondary | ICD-10-CM

## 2019-09-24 DIAGNOSIS — Z3400 Encounter for supervision of normal first pregnancy, unspecified trimester: Secondary | ICD-10-CM

## 2019-09-24 DIAGNOSIS — Z3200 Encounter for pregnancy test, result unknown: Secondary | ICD-10-CM

## 2019-09-24 HISTORY — DX: Encounter for supervision of normal first pregnancy, unspecified trimester: Z34.00

## 2019-09-24 LAB — POCT URINE PREGNANCY: Preg Test, Ur: POSITIVE — AB

## 2019-09-24 MED ORDER — BLOOD PRESSURE KIT DEVI: 1.0000 | 0 refills | Status: DC | PRN

## 2019-09-24 NOTE — Progress Notes (Signed)
..      I connected with April [name] on 09/24/19 at  8:20 AM EDT in office and verified that I am speaking with the correct person using two identifiers.  Location: [facility] Patient: April [name] Provider: Nurse visit   I discussed the limitations, risks, security and privacy concerns of performing an evaluation and management service by telephone and the availability of in person appointments. I also discussed with the patient that there may be a patient responsible charge related to this service. The patient expressed understanding and agreed to proceed.   History of Present Illness: PRENATAL INTAKE SUMMARY  April [name] presents today New OB Nurse Interview.  OB History    Gravida  1   Para  0   Term  0   Preterm  0   AB  0   Living  0     SAB  0   TAB  0   Ectopic  0   Multiple  0   Live Births             I have reviewed the patient's medical, obstetrical, social, and family histories, medications, and available lab results.  SUBJECTIVE She has no unusual complaints   Observations/Objective: Initial nurse interview for history/labs (New OB)  LMP: 08-25-19 EDD: 05-31-2020 GA: 4w 2d GP: G1P0  GENERAL APPEARANCE: alert, well appearing  Assessment and Plan: Normal first pregnancy Pt denies any history of pre-existing medical conditions, BP cuff [facility], Babyrx sent to e-mail.    Follow Up Instructions:   I discussed the assessment and treatment plan with the patient. The patient was provided an opportunity to ask questions and all were answered. The patient agreed with the plan and demonstrated an understanding of the instructions.   The patient was advised to call back or seek an in-person evaluation if the symptoms worsen or if the condition fails to improve as anticipated.  I provided 15 minutes of face-to-face time during this encounter.   Katrina Stack, RN

## 2019-09-24 NOTE — Progress Notes (Signed)
Patient was assessed and managed by nursing staff during this encounter. I have reviewed the chart and agree with the documentation and plan. I have also made any necessary editorial changes.  Catalina Antigua, MD 09/24/2019 10:05 AM

## 2019-11-12 ENCOUNTER — Encounter: Payer: BC Managed Care – PPO | Admitting: Nurse Practitioner

## 2019-11-15 ENCOUNTER — Other Ambulatory Visit (HOSPITAL_COMMUNITY)
Admission: RE | Admit: 2019-11-15 | Discharge: 2019-11-15 | Disposition: A | Discharge status: Home / Self Care | Payer: BC Managed Care – PPO | Source: Ambulatory Visit | Attending: Obstetrics and Gynecology | Admitting: Obstetrics and Gynecology

## 2019-11-15 ENCOUNTER — Ambulatory Visit (INDEPENDENT_AMBULATORY_CARE_PROVIDER_SITE_OTHER): Payer: BC Managed Care – PPO | Admitting: Obstetrics and Gynecology

## 2019-11-15 ENCOUNTER — Encounter: Payer: Self-pay | Admitting: General Practice

## 2019-11-15 ENCOUNTER — Other Ambulatory Visit: Payer: Self-pay

## 2019-11-15 VITALS — BP 115/77 | HR 109 | Temp 98.2°F | Wt 286.6 lb

## 2019-11-15 DIAGNOSIS — Z3401 Encounter for supervision of normal first pregnancy, first trimester: Secondary | ICD-10-CM | POA: Insufficient documentation

## 2019-11-15 DIAGNOSIS — Z3A11 11 weeks gestation of pregnancy: Secondary | ICD-10-CM

## 2019-11-15 DIAGNOSIS — Z3400 Encounter for supervision of normal first pregnancy, unspecified trimester: Secondary | ICD-10-CM

## 2019-11-15 DIAGNOSIS — O99211 Obesity complicating pregnancy, first trimester: Secondary | ICD-10-CM

## 2019-11-15 MED ORDER — ASPIRIN 81 MG PO CHEW: 81.0000 mg | CHEWABLE_TABLET | Freq: Every day | ORAL | 6 refills | Status: DC

## 2019-11-15 NOTE — Patient Instructions (Addendum)
How a Baby Grows During Pregnancy  Pregnancy begins when a female's sperm enters a female's egg (fertilization). Fertilization usually happens in one of the tubes (fallopian tubes) that connect the ovaries to the womb (uterus). The fertilized egg moves down the fallopian tube to the uterus. Once it reaches the uterus, it implants into the lining of the uterus and begins to grow. For the first 10 weeks, the fertilized egg is called an embryo. After 10 weeks, it is called a fetus. As the fetus continues to grow, it receives oxygen and nutrients through tissue (placenta) that grows to support the developing baby. The placenta is the life support system for the baby. It provides oxygen and nutrition and removes waste. Learning as much as you can about your pregnancy and how your baby is developing can help you enjoy the experience. It can also make you aware of when there might be a problem and when to ask questions. How long does a typical pregnancy last? A pregnancy usually lasts 280 days, or about 40 weeks. Pregnancy is divided into three periods of growth, also called trimesters:  First trimester: 0-12 weeks.  Second trimester: 13-27 weeks.  Third trimester: 28-40 weeks. The day when your baby is ready to be born (full term) is your estimated date of delivery. How does my baby develop month by month? First month  The fertilized egg attaches to the inside of the uterus.  Some cells will form the placenta. Others will form the fetus.  The arms, legs, brain, spinal cord, lungs, and heart begin to develop.  At the end of the first month, the heart begins to beat. Second month  The bones, inner ear, eyelids, hands, and feet form.  The genitals develop.  By the end of 8 weeks, all major organs are developing. Third month  All of the internal organs are forming.  Teeth develop below the gums.  Bones and muscles begin to grow. The spine can flex.  The skin is transparent.  Fingernails  and toenails begin to form.  Arms and legs continue to grow longer, and hands and feet develop.  The fetus is about 3 inches (7.6 cm) long. Fourth month  The placenta is completely formed.  The external sex organs, neck, outer ear, eyebrows, eyelids, and fingernails are formed.  The fetus can hear, swallow, and move its arms and legs.  The kidneys begin to produce urine.  The skin is covered with a white, waxy coating (vernix) and very fine hair (lanugo). Fifth month  The fetus moves around more and can be felt for the first time (quickening).  The fetus starts to sleep and wake up and may begin to suck its finger.  The nails grow to the end of the fingers.  The organ in the digestive system that makes bile (gallbladder) functions and helps to digest nutrients.  If your baby is a girl, eggs are present in her ovaries. If your baby is a boy, testicles start to move down into his scrotum. Sixth month  The lungs are formed.  The eyes open. The brain continues to develop.  Your baby has fingerprints and toe prints. Your baby's hair grows thicker.  At the end of the second trimester, the fetus is about 9 inches (22.9 cm) long. Seventh month  The fetus kicks and stretches.  The eyes are developed enough to sense changes in light.  The hands can make a grasping motion.  The fetus responds to sound. Eighth month  All   organs and body systems are fully developed and functioning.  Bones harden, and taste buds develop. The fetus may hiccup.  Certain areas of the brain are still developing. The skull remains soft. Ninth month  The fetus gains about  lb (0.23 kg) each week.  The lungs are fully developed.  Patterns of sleep develop.  The fetus's head typically moves into a head-down position (vertex) in the uterus to prepare for birth.  The fetus weighs 6-9 lb (2.72-4.08 kg) and is 19-20 inches (48.26-50.8 cm) long. What can I do to have a healthy pregnancy and help  my baby develop? General instructions  Take prenatal vitamins as directed by your health care provider. These include vitamins such as folic acid, iron, calcium, and vitamin D. They are important for healthy development.  Take medicines only as directed by your health care provider. Read labels and ask a pharmacist or your health care provider whether over-the-counter medicines, supplements, and prescription drugs are safe to take during pregnancy.  Keep all follow-up visits as directed by your health care provider. This is important. Follow-up visits include prenatal care and screening tests. How do I know if my baby is developing well? At each prenatal visit, your health care provider will do several different tests to check on your health and keep track of your baby's development. These include:  Fundal height and position. ? Your health care provider will measure your growing belly from your pubic bone to the top of the uterus using a tape measure. ? Your health care provider will also feel your belly to determine your baby's position.  Heartbeat. ? An ultrasound in the first trimester can confirm pregnancy and show a heartbeat, depending on how far along you are. ? Your health care provider will check your baby's heart rate at every prenatal visit.  Second trimester ultrasound. ? This ultrasound checks your baby's development. It also may show your baby's gender. What should I do if I have concerns about my baby's development? Always talk with your health care provider about any concerns that you may have about your pregnancy and your baby. Summary  A pregnancy usually lasts 280 days, or about 40 weeks. Pregnancy is divided into three periods of growth, also called trimesters.  Your health care provider will monitor your baby's growth and development throughout your pregnancy.  Follow your health care provider's recommendations about taking prenatal vitamins and medicines during  your pregnancy.  Talk with your health care provider if you have any concerns about your pregnancy or your developing baby. This information is not intended to replace advice given to you by your health care provider. Make sure you discuss any questions you have with your health care provider. Document Revised: 06/08/2018 Document Reviewed: 12/29/2016 Elsevier Patient Education  2020 [employer]. First Trimester of Pregnancy The first trimester of pregnancy is from week 1 until the end of week 13 (months 1 through 3). A week after a sperm fertilizes an egg, the egg will implant on the wall of the uterus. This embryo will begin to develop into a baby. Genes from you and your partner will form the baby. The female genes will determine whether the baby will be a boy or a girl. At 6-8 weeks, the eyes and face will be formed, and the heartbeat can be seen on ultrasound. At the end of 12 weeks, all the baby's organs will be formed. Now that you are pregnant, you will want to do everything you can to have  a healthy baby. Two of the most important things are to get good prenatal care and to follow your health care provider's instructions. Prenatal care is all the medical care you receive before the baby's birth. This care will help prevent, find, and treat any problems during the pregnancy and childbirth. Body changes during your first trimester Your body goes through many changes during pregnancy. The changes vary from woman to woman.  You may gain or lose a couple of pounds at first.  You may feel sick to your stomach (nauseous) and you may throw up (vomit). If the vomiting is uncontrollable, call your health care provider.  You may tire easily.  You may develop headaches that can be relieved by medicines. All medicines should be approved by your health care provider.  You may urinate more often. Painful urination may mean you have a bladder infection.  You may develop heartburn as a result of your  pregnancy.  You may develop constipation because certain hormones are causing the muscles that push stool through your intestines to slow down.  You may develop hemorrhoids or swollen veins (varicose veins).  Your breasts may begin to grow larger and become tender. Your nipples may stick out more, and the tissue that surrounds them (areola) may become darker.  Your gums may bleed and may be sensitive to brushing and flossing.  Dark spots or blotches (chloasma, mask of pregnancy) may develop on your face. This will likely fade after the baby is born.  Your menstrual periods will stop.  You may have a loss of appetite.  You may develop cravings for certain kinds of food.  You may have changes in your emotions from day to day, such as being excited to be pregnant or being concerned that something may go wrong with the pregnancy and baby.  You may have more vivid and strange dreams.  You may have changes in your hair. These can include thickening of your hair, rapid growth, and changes in texture. Some women also have hair loss during or after pregnancy, or hair that feels dry or thin. Your hair will most likely return to normal after your baby is born. What to expect at prenatal visits During a routine prenatal visit:  You will be weighed to make sure you and the baby are growing normally.  Your blood pressure will be taken.  Your abdomen will be measured to track your baby's growth.  The fetal heartbeat will be listened to between weeks 10 and 14 of your pregnancy.  Test results from any previous visits will be discussed. Your health care provider may ask you:  How you are feeling.  If you are feeling the baby move.  If you have had any abnormal symptoms, such as leaking fluid, bleeding, severe headaches, or abdominal cramping.  If you are using any tobacco products, including cigarettes, chewing tobacco, and electronic cigarettes.  If you have any questions. Other tests  that may be performed during your first trimester include:  Blood tests to find your blood type and to check for the presence of any previous infections. The tests will also be used to check for low iron levels (anemia) and protein on red blood cells (Rh antibodies). Depending on your risk factors, or if you previously had diabetes during pregnancy, you may have tests to check for high blood sugar that affects pregnant women (gestational diabetes).  Urine tests to check for infections, diabetes, or protein in the urine.  An ultrasound to confirm the  proper growth and development of the baby.  Fetal screens for spinal cord problems (spina bifida) and Down syndrome.  HIV (human immunodeficiency virus) testing. Routine prenatal testing includes screening for HIV, unless you choose not to have this test.  You may need other tests to make sure you and the baby are doing well. Follow these instructions at home: Medicines  Follow your health care provider's instructions regarding medicine use. Specific medicines may be either safe or unsafe to take during pregnancy.  Take a prenatal vitamin that contains at least 600 micrograms (mcg) of folic acid.  If you develop constipation, try taking a stool softener if your health care provider approves. Eating and drinking   Eat a balanced diet that includes fresh fruits and vegetables, whole grains, good sources of protein such as meat, eggs, or tofu, and low-fat dairy. Your health care provider will help you determine the amount of weight gain that is right for you.  Avoid raw meat and uncooked cheese. These carry germs that can cause birth defects in the baby.  Eating four or five small meals rather than three large meals a day may help relieve nausea and vomiting. If you start to feel nauseous, eating a few soda crackers can be helpful. Drinking liquids between meals, instead of during meals, also seems to help ease nausea and vomiting.  Limit foods  that are high in fat and processed sugars, such as fried and sweet foods.  To prevent constipation: ? Eat foods that are high in fiber, such as fresh fruits and vegetables, whole grains, and beans. ? Drink enough fluid to keep your urine clear or pale yellow. Activity  Exercise only as directed by your health care provider. Most women can continue their usual exercise routine during pregnancy. Try to exercise for 30 minutes at least 5 days a week. Exercising will help you: ? Control your weight. ? Stay in shape. ? Be prepared for labor and delivery.  Experiencing pain or cramping in the lower abdomen or lower back is a good sign that you should stop exercising. Check with your health care provider before continuing with normal exercises.  Try to avoid standing for long periods of time. Move your legs often if you must stand in one place for a long time.  Avoid heavy lifting.  Wear low-heeled shoes and practice good posture.  You may continue to have sex unless your health care provider tells you not to. Relieving pain and discomfort  Wear a good support bra to relieve breast tenderness.  Take warm sitz baths to soothe any pain or discomfort caused by hemorrhoids. Use hemorrhoid cream if your health care provider approves.  Rest with your legs elevated if you have leg cramps or low back pain.  If you develop varicose veins in your legs, wear support hose. Elevate your feet for 15 minutes, 3-4 times a day. Limit salt in your diet. Prenatal care  Schedule your prenatal visits by the twelfth week of pregnancy. They are usually scheduled monthly at first, then more often in the last 2 months before delivery.  Write down your questions. Take them to your prenatal visits.  Keep all your prenatal visits as told by your health care provider. This is important. Safety  Wear your seat belt at all times when driving.  Make a list of emergency phone numbers, including numbers for family,  friends, the hospital, and police and fire departments. General instructions  Ask your health care provider for a referral to  a local prenatal education class. Begin classes no later than the beginning of month 6 of your pregnancy.  Ask for help if you have counseling or nutritional needs during pregnancy. Your health care provider can offer advice or refer you to specialists for help with various needs.  Do not use hot tubs, steam rooms, or saunas.  Do not douche or use tampons or scented sanitary pads.  Do not cross your legs for long periods of time.  Avoid cat litter boxes and soil used by cats. These carry germs that can cause birth defects in the baby and possibly loss of the fetus by miscarriage or stillbirth.  Avoid all smoking, herbs, alcohol, and medicines not prescribed by your health care provider. Chemicals in these products affect the formation and growth of the baby.  Do not use any products that contain nicotine or tobacco, such as cigarettes and e-cigarettes. If you need help quitting, ask your health care provider. You may receive counseling support and other resources to help you quit.  Schedule a dentist appointment. At home, brush your teeth with a soft toothbrush and be gentle when you floss. Contact a health care provider if:  You have dizziness.  You have mild pelvic cramps, pelvic pressure, or nagging pain in the abdominal area.  You have persistent nausea, vomiting, or diarrhea.  You have a bad smelling vaginal discharge.  You have pain when you urinate.  You notice increased swelling in your face, hands, legs, or ankles.  You are exposed to fifth disease or chickenpox.  You are exposed to [place] measles (rubella) and have never had it. Get help right away if:  You have a fever.  You are leaking fluid from your vagina.  You have spotting or bleeding from your vagina.  You have severe abdominal cramping or pain.  You have rapid weight gain or  loss.  You vomit blood or material that looks like coffee grounds.  You develop a severe headache.  You have shortness of breath.  You have any kind of trauma, such as from a fall or a car accident. Summary  The first trimester of pregnancy is from week 1 until the end of week 13 (months 1 through 3).  Your body goes through many changes during pregnancy. The changes vary from woman to woman.  You will have routine prenatal visits. During those visits, your health care provider will examine you, discuss any test results you may have, and talk with you about how you are feeling. This information is not intended to replace advice given to you by your health care provider. Make sure you discuss any questions you have with your health care provider. Document Revised: 01/28/2017 Document Reviewed: 01/28/2016 Elsevier Patient Education  2020 [employer].  Hormonal Contraception Information Hormonal contraception is a type of birth control that uses hormones to prevent pregnancy. It usually involves a combination of the hormones estrogen and progesterone or only the hormone progesterone. Hormonal contraception works in these ways:  It thickens the mucus in the cervix, making it harder for sperm to enter the uterus.  It changes the lining of the uterus, making it harder for an egg to implant.  It may stop the ovaries from releasing eggs (ovulation). Some women who take hormonal contraceptives that contain only progesterone may continue to ovulate. Hormonal contraception cannot prevent sexually transmitted infections (STIs). Pregnancy may still occur. Estrogen and progesterone contraceptives Contraceptives that use a combination of estrogen and progesterone are available in these forms:  Pill. Pills come in different combinations of hormones. They must be taken at the same time each day. Pills can affect your period, causing you to get your period once every three months or not at  all.  Patch. The patch must be worn on the lower abdomen for three weeks and then removed on the fourth.  Vaginal ring. The ring is placed in the vagina and left there for three weeks. It is then removed for one week. Progesterone contraceptives Contraceptives that use progesterone only are available in these forms:  Pill. Pills should be taken every day of the cycle.  Intrauterine device (IUD). This device is inserted into the uterus and removed or replaced every five years or sooner.  Implant. Plastic rods are placed under the skin of the upper arm. They are removed or replaced every three years or sooner.  Injection. The injection is given once every 90 days. What are the side effects? The side effects of estrogen and progesterone contraceptives include:  Nausea.  Headaches.  Breast tenderness.  Bleeding or spotting between menstrual cycles.  High blood pressure (rare).  Strokes, heart attacks, or blood clots (rare) Side effects of progesterone-only contraceptives include:  Nausea.  Headaches.  Breast tenderness.  Unpredictable menstrual bleeding.  High blood pressure (rare). Talk to your health care provider about what side effects may affect you. Where to find more information  Ask your health care provider for more information and resources about hormonal contraception.  [place] Department of Health and [employer] on Women's Health: http://hoffman.[email or web address]/ Questions to ask:  What type of hormonal contraception is right for me?  How long should I plan to use hormonal contraception?  What are the side effects of the hormonal contraception method I choose?  How can I prevent STIs while using hormonal contraception? Contact a health care provider if:  You start taking hormonal contraceptives and you develop persistent or severe side effects. Summary  Estrogen and progesterone are hormones used in many forms of birth control.  Talk to your health  care provider about what side effects may affect you.  Hormonal contraception cannot prevent sexually transmitted infections (STIs).  Ask your health care provider for more information and resources about hormonal contraception. This information is not intended to replace advice given to you by your health care provider. Make sure you discuss any questions you have with your health care provider. Document Revised: 06/12/2018 Document Reviewed: 01/16/2016 Elsevier Patient Education  2020 [employer].

## 2019-11-15 NOTE — Progress Notes (Signed)
INITIAL OBSTETRICAL VISIT Patient name: April [name] MRN [id number]  Date of birth: 04-Jan-1991 Chief Complaint:   Initial Prenatal Visit  History of Present Illness:   April [name] is a 29 y.o. G56P0000 African American female at [redacted]w[redacted]d by LMP with an Estimated Date of Delivery: 05/31/20 being seen today for her initial obstetrical visit.  Her obstetrical history is significant for obesity. This is a planned pregnancy. She and the father of the baby (FOB) "Geno" live together. She has a support system that consists of her husband/family/friends. Today she reports no complaints. She reports she received the COIVD-19 vaccine 05/2019 and 06/2019.   Patient's last menstrual period was 08/25/2019. Last pap 02/07/2019. Results were: normal Review of Systems:   Pertinent items are noted in HPI Denies cramping/contractions, leakage of fluid, vaginal bleeding, abnormal vaginal discharge w/ itching/odor/irritation, headaches, visual changes, shortness of breath, chest pain, abdominal pain, severe nausea/vomiting, or problems with urination or bowel movements unless otherwise stated above.  Pertinent History Reviewed:  Reviewed past medical,surgical, social, obstetrical and family history.  Reviewed problem list, medications and allergies. OB History  Gravida Para Term Preterm AB Living  1 0 0 0 0 0  SAB TAB Ectopic Multiple Live Births  0 0 0 0      # Outcome Date GA Lbr Len/2nd Weight Sex Delivery Anes PTL Lv  1 Current            Physical Assessment:   Vitals:   11/15/19 0948  BP: 115/77  Pulse: (!) 109  Temp: 98.2 F (36.8 C)  Weight: 286 lb 9.6 oz (130 kg)  Body mass index is 49.19 kg/m.       Physical Examination:  General appearance - well appearing, and in no distress  Mental status - alert, oriented to person, place, and time  Psych:  She has a normal mood and affect  Skin - warm and dry, normal color, no suspicious lesions noted  Chest - effort normal, all  lung fields clear to auscultation bilaterally  Heart - normal rate and regular rhythm  Abdomen - soft, nontender  Extremities:  No swelling or varicosities noted  Pelvic - VULVA: normal appearing vulva with no masses, tenderness or lesions  VAGINA: normal appearing vagina with normal color and discharge, no lesions.   CERVIX: normal appearing cervix without discharge or lesions, no CMT  Thin prep pap is not done    FHTs by doppler: 152 bpm  Assessment & Plan:  1) Low-Risk Pregnancy G1P0000 at [redacted]w[redacted]d with an Estimated Date of Delivery: 05/31/20   2) Initial OB visit - Welcomed to practice and introduced self to patient in addition to discussing other advanced practice providers that she may be seeing at this practice - Congratulated patient - Anticipatory guidance on upcoming appointments - Educated on COVID19 and pregnancy and the integration of virtual appointments  - Educated on babyscripts app- patient reports she has not received email, encouraged to look in spam folder and to call office if she still has not received email - patient verbalizes understanding    3) Obesity affecting pregnancy in first trimester - Cervicovaginal ancillary only( [place]) - Genetic Screening - Culture, OB Urine - CBC/D/Plt+RPR+Rh+ABO+Rub Ab... - Hemoglobin A1c - Glucose - aspirin 81 MG chewable tablet; Chew 1 tablet (81 mg total) by mouth daily.  Dispense: 30 tablet; Refill: 6 - [place] MFM OB DETAIL +14 WK; Future  4) Supervision of normal first pregnancy, antepartum - Cervicovaginal ancillary only( [place]) -  Genetic Screening - Culture, OB Urine - CBC/D/Plt+RPR+Rh+ABO+Rub Ab... - Hemoglobin A1c - Glucose - [place] MFM OB DETAIL +14 WK; Future  5) [redacted] weeks gestation of pregnancy   Meds:  Meds ordered this encounter  Medications  . aspirin 81 MG chewable tablet    Sig: Chew 1 tablet (81 mg total) by mouth daily.    Dispense:  30 tablet    Refill:  6    Order Specific Question:    Supervising Provider    Answer:   Reva Bores [[id number]]    Initial labs obtained Continue prenatal vitamins Reviewed n/v relief measures and warning s/s to report Reviewed recommended weight gain based on pre-gravid BMI Encouraged well-balanced diet Genetic Screening discussed: ordered Cystic fibrosis, SMA, Fragile X screening discussed ordered The nature of [facility] with multiple MDs and other Advanced Practice Providers was explained to patient; also emphasized that residents, students are part of our team.  Discussed optimized OB schedule and video visits. Advised can have an in-office visit whenever she feels she needs to be seen.  Does not have own BP cuff. Explained to patient that she will need to purchase a BP cuff and scale for virtual visits. Check BP weekly, let us know if >140/90. Advised to call during normal business hours and there is an after-hours nurse line available.    Follow-up: Return in about 2 months (around 01/15/2020) for Return OB - My Chart video.   Orders Placed This Encounter  Procedures  . Culture, OB Urine  . [place] MFM OB DETAIL +14 WK  . Genetic Screening  . CBC/D/Plt+RPR+Rh+ABO+Rub Ab...  . Hemoglobin A1c  . Glucose    Raelyn Mora MSN, CNM 11/15/2019

## 2019-11-16 LAB — CBC/D/PLT+RPR+RH+ABO+RUB AB...
Antibody Screen: NEGATIVE
Basophils Absolute: 0 10*3/uL (ref 0.0–0.2)
Basos: 0 %
EOS (ABSOLUTE): 0.1 10*3/uL (ref 0.0–0.4)
Eos: 2 %
HCV Ab: <0.1 s/co ratio (ref 0.0–0.9)
HIV Screen 4th Generation wRfx: NONREACTIVE
Hematocrit: 36.5 % (ref 34.0–46.6)
Hemoglobin: 11.5 g/dL (ref 11.1–15.9)
Hepatitis B Surface Ag: NEGATIVE
Immature Grans (Abs): 0 10*3/uL (ref 0.0–0.1)
Immature Granulocytes: 0 %
Lymphocytes Absolute: 1.5 10*3/uL (ref 0.7–3.1)
Lymphs: 31 %
MCH: 28.4 pg (ref 26.6–33.0)
MCHC: 31.5 g/dL (ref 31.5–35.7)
MCV: 90 fL (ref 79–97)
Monocytes Absolute: 0.3 10*3/uL (ref 0.1–0.9)
Monocytes: 7 %
Neutrophils Absolute: 3 10*3/uL (ref 1.4–7.0)
Neutrophils: 60 %
Platelets: 293 10*3/uL (ref 150–450)
RBC: 4.05 x10E6/uL (ref 3.77–5.28)
RDW: 15.3 % (ref 11.7–15.4)
RPR Ser Ql: NONREACTIVE
Rh Factor: POSITIVE
Rubella Antibodies, IGG: 5.54 index (ref >0.99)
WBC: 4.9 10*3/uL (ref 3.4–10.8)

## 2019-11-16 LAB — GLUCOSE, RANDOM: Glucose: 86 mg/dL (ref 65–99)

## 2019-11-16 LAB — HEMOGLOBIN A1C
Est. average glucose Bld gHb Est-mCnc: 100 mg/dL
Hgb A1c MFr Bld: 5.1 % (ref 4.8–5.6)

## 2019-11-16 LAB — HCV INTERPRETATION

## 2019-11-17 LAB — CULTURE, OB URINE

## 2019-11-17 LAB — URINE CULTURE, OB REFLEX

## 2019-11-18 ENCOUNTER — Encounter: Payer: Self-pay | Admitting: Obstetrics and Gynecology

## 2019-11-18 DIAGNOSIS — O99211 Obesity complicating pregnancy, first trimester: Secondary | ICD-10-CM | POA: Insufficient documentation

## 2019-11-19 LAB — CERVICOVAGINAL ANCILLARY ONLY
Bacterial Vaginitis (gardnerella): NEGATIVE
Candida Glabrata: NEGATIVE
Candida Vaginitis: NEGATIVE
Chlamydia: NEGATIVE
Comment: NEGATIVE
Comment: NEGATIVE
Comment: NEGATIVE
Comment: NEGATIVE
Comment: NEGATIVE
Comment: NORMAL
Neisseria Gonorrhea: NEGATIVE
Trichomonas: NEGATIVE

## 2019-11-22 ENCOUNTER — Encounter: Payer: BC Managed Care – PPO | Attending: Obstetrics and Gynecology | Admitting: Dietician

## 2019-11-22 ENCOUNTER — Encounter: Payer: Self-pay | Admitting: Dietician

## 2019-11-22 ENCOUNTER — Other Ambulatory Visit: Payer: Self-pay

## 2019-11-22 DIAGNOSIS — O99211 Obesity complicating pregnancy, first trimester: Secondary | ICD-10-CM | POA: Diagnosis present

## 2019-11-22 NOTE — Progress Notes (Signed)
Medical Nutrition Therapy   Primary concerns today: weight management during pregnancy  Referral diagnosis: O99.211- obesity affecting pregnancy in first trimester  Preferred learning style: no preference indicated Learning readiness: ready   NUTRITION ASSESSMENT   Lifestyle & Dietary Hx Patient states she would like to learn more about weight maintenance during pregnancy. Current BMI is greater than 45kg/m. Typical meal pattern is 3 meals per day plus ~2 snacks. Previously followed vegetarian diet for a few years, now is pescatarian. Will have some dairy products except for milk (will drink almond milk) and will have both eggs and egg substitutes. No food allergies.    Supplements: prenatal, omega-3  Current average weekly physical activity: ADLs   24-Hr Dietary Recall First Meal: oatmeal (or egg scramble) Snack: grapes (or Ritz crackers + peanut butter)  Second Meal: Zaxby's salad  Snack: chips (or Nutrigrain)  Third Meal: pizza (or veggie burger)  Snack: - Beverages: water, lemonade, juice, ginger ale    NUTRITION DIAGNOSIS  Inadequate protein intake (NI-5.6.1) related to increased needs during pregnancy as evidenced by patient reported dietary intake reflecting estimated inadequate protein compared to recommended intake during pregnancy.    NUTRITION INTERVENTION  Nutrition education (E-1) on the following topics:  . Pregnancy nutrition . Adequate protein intake while following vegetarian/pescatarian diet  Handouts Provided Include   Nutrition Guidelines During Pregnancy  Learning Style & Readiness for Change Teaching method utilized: Visual & Auditory  Demonstrated degree of understanding via: Teach Back  Barriers to learning/adherence to lifestyle change: None Identified    MONITORING & EVALUATION Dietary intake, weekly physical activity, and goals PRN.  Next Steps  Patient is to contact [facility] as needed for follow up.

## 2019-11-26 ENCOUNTER — Encounter: Payer: Self-pay | Admitting: General Practice

## 2019-11-27 ENCOUNTER — Encounter: Payer: Self-pay | Admitting: General Practice

## 2019-12-13 ENCOUNTER — Encounter: Payer: Self-pay | Admitting: General Practice

## 2019-12-17 ENCOUNTER — Telehealth: Payer: Self-pay | Admitting: Obstetrics and Gynecology

## 2019-12-17 NOTE — Telephone Encounter (Signed)
TC to get consent to share her contact information with [employer] Verbal consent received. Advised patient to expect a call from Ron Parker from [place]. Patient verbalized an understanding of the plan of care and agrees.   Raelyn Mora, CNM

## 2019-12-23 ENCOUNTER — Telehealth: Payer: Self-pay | Admitting: Obstetrics and Gynecology

## 2019-12-23 DIAGNOSIS — Z3400 Encounter for supervision of normal first pregnancy, unspecified trimester: Secondary | ICD-10-CM

## 2019-12-23 NOTE — Telephone Encounter (Signed)
TC to discuss information and recommendations from genetic counselor from [place], [employer]. Information shared about the outcome with 2 failed Panorama draws was the increased risk of SAB, PEC, PTB, aneuploidy. Some things that can cause low fetal fraction are: obesity, blood thinners and autoimmune disease. Patient has only obesity as a risk factor. Per [place] the fetal fraction of 2.1 at 11.[redacted] wks gestation only going up to 2.5 at 14.[redacted] wks gestation has a likelihood of not having a good outcome. Referral to genetic counselor, [facility].  Raelyn Mora, CNM  12/23/2019

## 2019-12-24 ENCOUNTER — Inpatient Hospital Stay (HOSPITAL_COMMUNITY): Payer: BC Managed Care – PPO

## 2019-12-24 ENCOUNTER — Inpatient Hospital Stay (HOSPITAL_COMMUNITY)
Admission: AD | Admit: 2019-12-24 | Payer: BC Managed Care – PPO | Source: Home / Self Care | Admitting: Obstetrics and Gynecology

## 2019-12-27 ENCOUNTER — Telehealth: Payer: Self-pay | Admitting: *Deleted

## 2019-12-27 NOTE — Telephone Encounter (Signed)
Patient called reporting left side low back pain and limping. Patient is [redacted]w[redacted]d gestation. Advised patient to take Extra strength Tylenol and apply heat. Patient persist or worsen to call the office for an appointment.  Clovis Pu, RN

## 2020-01-10 ENCOUNTER — Ambulatory Visit: Payer: BC Managed Care – PPO

## 2020-01-11 ENCOUNTER — Ambulatory Visit (HOSPITAL_BASED_OUTPATIENT_CLINIC_OR_DEPARTMENT_OTHER): Payer: BC Managed Care – PPO

## 2020-01-11 ENCOUNTER — Ambulatory Visit: Payer: BC Managed Care – PPO | Attending: Obstetrics and Gynecology | Admitting: *Deleted

## 2020-01-11 ENCOUNTER — Encounter: Payer: Self-pay | Admitting: *Deleted

## 2020-01-11 ENCOUNTER — Other Ambulatory Visit: Payer: Self-pay

## 2020-01-11 VITALS — BP 124/76 | HR 102

## 2020-01-11 DIAGNOSIS — O99212 Obesity complicating pregnancy, second trimester: Secondary | ICD-10-CM | POA: Diagnosis present

## 2020-01-11 DIAGNOSIS — Z6841 Body Mass Index (BMI) 40.0 and over, adult: Secondary | ICD-10-CM

## 2020-01-11 DIAGNOSIS — Z3A19 19 weeks gestation of pregnancy: Secondary | ICD-10-CM | POA: Insufficient documentation

## 2020-01-11 DIAGNOSIS — Z3400 Encounter for supervision of normal first pregnancy, unspecified trimester: Secondary | ICD-10-CM | POA: Diagnosis not present

## 2020-01-11 DIAGNOSIS — O99211 Obesity complicating pregnancy, first trimester: Secondary | ICD-10-CM | POA: Diagnosis not present

## 2020-01-14 ENCOUNTER — Ambulatory Visit: Payer: BC Managed Care – PPO | Attending: Obstetrics and Gynecology | Admitting: Genetic Counselor

## 2020-01-14 ENCOUNTER — Ambulatory Visit: Payer: Self-pay | Admitting: Genetic Counselor

## 2020-01-14 ENCOUNTER — Other Ambulatory Visit: Payer: Self-pay | Admitting: *Deleted

## 2020-01-14 ENCOUNTER — Other Ambulatory Visit: Payer: Self-pay

## 2020-01-14 ENCOUNTER — Other Ambulatory Visit: Payer: BC Managed Care – PPO

## 2020-01-14 DIAGNOSIS — Z315 Encounter for genetic counseling: Secondary | ICD-10-CM

## 2020-01-14 DIAGNOSIS — Z36 Encounter for antenatal screening for chromosomal anomalies: Secondary | ICD-10-CM

## 2020-01-14 DIAGNOSIS — Z362 Encounter for other antenatal screening follow-up: Secondary | ICD-10-CM

## 2020-01-14 DIAGNOSIS — Z361 Encounter for antenatal screening for raised alphafetoprotein level: Secondary | ICD-10-CM

## 2020-01-14 DIAGNOSIS — Z3400 Encounter for supervision of normal first pregnancy, unspecified trimester: Secondary | ICD-10-CM

## 2020-01-14 NOTE — Progress Notes (Signed)
01/14/2020  April April 09-05-1990 MRN: [id number] DOV: 01/14/2020  April April presented to the [facility] for April genetics consultation regarding low fetal fraction on noninvasive prenatal screening (NIPS). April April was accompanied to her appointment by her husband, April April.   Indication for genetic counseling - Insufficient fetal fraction on NIPS  Prenatal history  April April is April April, 29 y.o. female. Her current pregnancy has completed [redacted]w[redacted]d (Estimated Date of Delivery: 05/31/20).  April April denied exposure to environmental toxins or chemical agents. She denied the use of alcohol, tobacco or street drugs. She reported taking prenatal vitamins, Omega-3, and aspirin. She denied significant viral illnesses, fevers, and bleeding during the course of her pregnancy. Her medical and surgical histories were noncontributory.  Family History  April three generation pedigree was drafted and reviewed. The family history is remarkable for the following:  - April April has April family history of cancer. Her father was diagnosed with prostate cancer at age 39, April paternal aunt had breast cancer, April maternal half aunt had cervical cancer, and her maternal half aunt's son had stomach cancer. We reviewed that although most cancers are thought to be sporadic or due to environmental factors, some families appear to have April predisposition to cancers. When considering April family history of cancer, we look for common types of cancer in multiple family members occurring at younger than typical ages. April April family history is not particularly striking for April hereditary cancer syndrome. However, we discussed the option of meeting with April cancer genetic counselor to discuss any possible screening or testing options available. If April April is concerned about the family history of cancer and would like to learn more about the family's chance for an inherited cancer syndrome, she or  her healthcare provider may refer her to the [facility] [phone]).   - Mr. Corliss paternal grandmother was born with April "hole in her heart". The exact etiology of this congenital heart defect (CHD) is unknown and records are not available. We discussed that there are multifactorial causes for isolated CHDs, including environmental and genetic factors. Since Mr. Wanamaker grandmother is April third degree relative to the current fetus, the chance of the current fetus having April CHD is likely not great elevated over the general population risk of 0.5-1% (assuming that her CHD is isolated). However, without knowing the precise cause for her CHD, risk assessment is limited.   The remaining family histories were reviewed and found to be noncontributory for birth defects, intellectual disability, recurrent pregnancy loss, and known genetic conditions.    The patient's ancestry is African American. The father of the pregnancy's ancestry is African American. Ashkenazi Jewish ancestry and consanguinity were denied. Pedigree will be scanned under Media.  Discussion  NIPS results:  April April was referred for genetic counseling as she had Panorama noninvasive prenatal screening (NIPS) that failed two times due to insufficient fetal fraction. Her first sample drawn at [redacted]w[redacted]d had April fetal fraction of 2.1%. Her second sample drawn at [redacted]w[redacted]d had April fetal fraction of 2.5%.  We reviewed that NIPS analyzes cell free DNA originating from the placenta that is found in the maternal blood circulation during pregnancy. This test is not diagnostic for chromosome conditions, but can provide information regarding the presence or absence of extra fetal DNA for chromosomes 13, 18, 21, and the sex chromosomes. Thus, it would not identify or rule out all fetal aneuploidy. The reported detection rate is 95-99% for  trisomies 42, 2, 70, and sex chromosome aneuploidies. The false positive rate is reported to be less than 0.1% for  any of these conditions. The term "fetal fraction" refers to the amount of sample that is believed to have come from placental DNA rather than maternal DNA.    We reviewed that there are many possible reasons April sample may have April low fetal fraction, including early gestational age, high maternal BMI, suboptimal sample collection, maternal use of medications like low molecular weight heparin, pregnancy complications, and normal variation. Low fetal fraction can also be associated with chromosomal aneuploidies, specifically trisomy 2, trisomy 48, and triploidy. Down syndrome and sex chromosome aneuploidies are not known to be associated with low fetal fraction.   Additional testing options:  We reviewed additional available screening/testing options. Firstly, April April may opt to continue monitoring the pregnancy through standard ultrasounds only. April April had her anatomy ultrasound performed last Friday. The ultrasound report was sent under separate cover. There were no visualized fetal anomalies or markers suggestive of aneuploidy; however, some views of the fetal anatomy were limited. April April was counseled that ~50% of fetuses with Down syndrome and 90-95% of fetuses with trisomy 91, trisomy 75, or triploidy demonstrate April sign of the respective conditions on anatomy ultrasound. Thus, April normal-appearing ultrasound decreases but does not eliminate the chance of April chromosomal aneuploidy being the cause for her low fetal fraction.  We also discussed that redrawing April new sample for NIPS is possible. April April has the option of pursuing April redraw for NIPS through [facility] (the laboratory that offers Panorama) or April different lab. The laboratory [facility] is validated for samples containing 1% fetal fraction or more; however, it cannot assess for triploidy. We reviewed that if April NIPS redraw is successful and contains April sufficient fetal fraction, results could clarify the risks for chromosomal aneuploidy in the  current pregnancy. We discussed that since she is now farther along in her pregnancy, the amount of fetal fraction present in April redrawn sample should be increased.   April April was also counseled regarding diagnostic testing via amniocentesis. We discussed the technical aspects of the procedure and quoted up to April 1 in 500 (0.2%) risk for spontaneous pregnancy loss or other adverse pregnancy outcomes as April result of amniocentesis. Cultured cells from an amniocentesis sample allow for the visualization of April fetal karyotype, which can detect >99% of large chromosomal aberrations. Chromosomal microarray can also be performed to identify smaller deletions or duplications of fetal chromosomal material. April April was informed that amniocentesis is the only way to definitively determine if April fetus has April chromosomal aneuploidy during the prenatal period.  After careful consideration, April April declined amniocentesis at this time. She instead indicated that she was interested in having April sample redrawn for NIPS. She preferred to pursue April redraw for [facility] since she has already paid for testing through [place]. She understands that if there is still an insufficient fetal fraction, she has the option of pursuing NIPS through April different laboratory.  Carrier screening results:  We also reviewed that April April had Horizon carrier screening performed through [place] which was negative for 14 conditions. Thus, her risk to be April carrier for these 14 conditions (listed separately in the laboratory report) has been reduced but not eliminated. This puts her pregnancies at significantly decreased risk of being affected by one of these conditions.  MS-AFP screening:  Lastly, we reviewed that screening for open neural tube defects (ONTDs) via MS-AFP in  the second trimester in addition to fetal anatomy ultrasound examination is recommended. We reviewed that April April's anatomy ultrasound did not detect any ONTDs and that  anatomy ultrasound is able to detect ONTDs with 90-95% sensitivity. However, normal results from ultrasound cannot not guarantee April normal baby. Ms. Ackerley indicated that she was interested in MS-AFP screening.  Plan:  Ms. Westendorf had April sample redrawn for Panorama NIPS as well as MS-AFP screening today. Results from NIPS will take 7-10 days to be returned. Results from MS-AFP screening will take 2-5 days to be returned. I will call Ms. Grewe once results become available.  I counseled Ms. Tanton regarding the above risks and available options. Second year [employer] genetic counseling student Jacklynn Lewis participated in portions of today's session under my supervision. The approximate face-to-face time with the genetic counselor was 60 minutes.  In summary:  Discussed NIPS results and options for follow-up testing  Insufficient fetal fraction on NIPS at [redacted]w[redacted]d & [redacted]w[redacted]d  Opted to have sample redrawn for Panorama NIPS. We will follow results  Reviewed results of ultrasound  No fetal anomalies or markers seen  Reduction in risk for fetal aneuploidy  Discussed carrier screening results  Negative Horizon-14  Offered additional testing and screening  Declined amniocentesis  Had sample drawn for MS-AFP analysis. We will follow results  Reviewed family history concerns   Gershon Crane, MS, [facility] Genetic Counselor

## 2020-01-16 ENCOUNTER — Telehealth: Payer: Self-pay | Admitting: Genetic Counselor

## 2020-01-16 ENCOUNTER — Encounter: Payer: Self-pay | Admitting: *Deleted

## 2020-01-16 LAB — AFP, SERUM, OPEN SPINA BIFIDA
AFP MoM: 1.08
AFP Value: 48.3 ng/mL
Gest. Age on Collection Date: 20.2 weeks
Maternal Age At EDD: 30.2 yr
OSBR Risk 1 IN: 10000
Test Results:: NEGATIVE
Weight: 284 [lb_av]

## 2020-01-16 NOTE — Telephone Encounter (Signed)
Second year [employer] genetic counseling student called April [name] under my supervision to discuss her normal MS-AFP screening results. These results decreased the risk of an open neural tube defect (ONTD) such as spina bifida in the fetus to 1 in 10,000. We discussed that anatomy ultrasound and MS-AFP are able to detect ONTDs with ~95% sensitivity; thus, it is extremely unlikely that the current fetus has an ONTD.  We reminded Ms. Parham that results from her Cindy Hazy redraw are still pending. We will call her once those results become available. Ms. Timmers confirmed that she had no further questions at this time.  Gershon Crane, MS, [facility] Genetic Counselor

## 2020-01-17 ENCOUNTER — Telehealth (INDEPENDENT_AMBULATORY_CARE_PROVIDER_SITE_OTHER): Payer: BC Managed Care – PPO | Admitting: Obstetrics and Gynecology

## 2020-01-17 VITALS — BP 128/78 | Wt 286.0 lb

## 2020-01-17 DIAGNOSIS — Z3A20 20 weeks gestation of pregnancy: Secondary | ICD-10-CM

## 2020-01-17 DIAGNOSIS — O99212 Obesity complicating pregnancy, second trimester: Secondary | ICD-10-CM

## 2020-01-17 DIAGNOSIS — O99211 Obesity complicating pregnancy, first trimester: Secondary | ICD-10-CM

## 2020-01-17 DIAGNOSIS — Z3400 Encounter for supervision of normal first pregnancy, unspecified trimester: Secondary | ICD-10-CM

## 2020-01-17 DIAGNOSIS — E669 Obesity, unspecified: Secondary | ICD-10-CM

## 2020-01-17 NOTE — Progress Notes (Signed)
   MY CHART VIDEO VIRTUAL OBSTETRICS VISIT ENCOUNTER NOTE  I connected with April [name] on 01/18/20 at  1:10 PM EST by My Chart video at home and verified that I am speaking with the correct person using two identifiers. Provider located at [employer] for [employer] at [place].   I discussed the limitations, risks, security and privacy concerns of performing an evaluation and management service by My Chart video and the availability of in person appointments. I also discussed with the patient that there may be a patient responsible charge related to this service. The patient expressed understanding and agreed to proceed.  Subjective:  April [name] is a 29 y.o. G1P0000 at [redacted]w[redacted]d being followed for ongoing prenatal care.  She is currently monitored for the following issues for this low-risk pregnancy and has Dysmenorrhea; Vitamin D deficiency; Supervision of normal first pregnancy, antepartum; and Obesity affecting pregnancy in first trimester on their problem list.  Patient reports no complaints. She had blood drawn for Panorama for a 3rd time. Reports occasional fetal movements when she's sitting or laying down. Denies any contractions, bleeding or leaking of fluid.   The following portions of the patient's history were reviewed and updated as appropriate: allergies, current medications, past family history, past medical history, past social history, past surgical history and problem list.   Objective:   General:  Alert, oriented and cooperative.   Mental Status: Normal mood and affect perceived. Normal judgment and thought content.  Rest of physical exam deferred due to type of encounter  BP 128/78   Wt 286 lb (129.7 kg)   LMP 08/25/2019   BMI 49.09 kg/m  **Done by patient's own at home BP cuff and scale  Assessment and Plan:  Pregnancy: G1P0000 at [redacted]w[redacted]d  1. Supervision of normal first pregnancy, antepartum - Discussed normal fetal movement perceptions at  this gestation - Discussed ultrasound results  2. Obesity affecting pregnancy in first trimester  Preterm labor symptoms and general obstetric precautions including but not limited to vaginal bleeding, contractions, leaking of fluid and fetal movement were reviewed in detail with the patient.  I discussed the assessment and treatment plan with the patient. The patient was provided an opportunity to ask questions and all were answered. The patient agreed with the plan and demonstrated an understanding of the instructions. The patient was advised to call back or seek an in-person office evaluation/go to [facility] at [facility] for any urgent or concerning symptoms. Please refer to After Visit Summary for other counseling recommendations.   I provided 5 minutes of non-face-to-face time during this encounter. There was 5 minutes of chart review time spent prior to this encounter. Total time spent = 10 minutes.  No follow-ups on file.  Future Appointments  Date Time Provider Department Center  02/08/2020  2:15 PM [facility] NURSE [facility]  02/08/2020  2:30 PM [facility] US3 [facility]  02/14/2020  3:50 PM Raelyn Mora, CNM [facility] None    Raelyn Mora, CNM Center for [employer], [facility]

## 2020-01-18 ENCOUNTER — Encounter: Payer: Self-pay | Admitting: Obstetrics and Gynecology

## 2020-01-21 ENCOUNTER — Other Ambulatory Visit: Payer: Self-pay

## 2020-01-22 ENCOUNTER — Telehealth: Payer: Self-pay | Admitting: Genetic Counselor

## 2020-01-22 NOTE — Telephone Encounter (Signed)
I called Ms. Collymore to discuss her negative noninvasive prenatal screening (NIPS). Ms. Bickert had a sample redrawn for Panorama NIPS on 01/14/20 as her two previous samples failed due to low fetal fraction. This new result had a sufficient fetal fraction and results were low-risk AKA negative. These negative results demonstrated an expected representation of chromosome 21, 18, 13, and sex chromosome material, greatly reducing the likelihood of trisomies 4, 37, or 67, sex chromosome aneuploidies, and triploidy for the pregnancy. Expected fetal sex was confirmed to be female.  NIPS analyzes placental (fetal) DNA in maternal circulation. NIPS is considered to be highly specific and sensitive, but is not considered to be a diagnostic test. We reviewed that this testing identifies 95-99% of pregnancies with trisomies 39, 38, and 68, as well as sex chromosome abnormalities, but does not test for all genetic conditions. Diagnostic testing via amniocentesis is available should Ms. Foglio be interested in confirming this result. She confirmed that she had no questions about these results at this time.  Gershon Crane, MS, [facility] Genetic Counselor

## 2020-01-28 ENCOUNTER — Encounter: Payer: Self-pay | Admitting: *Deleted

## 2020-02-08 ENCOUNTER — Other Ambulatory Visit: Payer: Self-pay

## 2020-02-08 ENCOUNTER — Ambulatory Visit: Payer: BC Managed Care – PPO | Attending: Obstetrics and Gynecology

## 2020-02-08 ENCOUNTER — Ambulatory Visit: Payer: BC Managed Care – PPO | Admitting: *Deleted

## 2020-02-08 ENCOUNTER — Encounter: Payer: Self-pay | Admitting: *Deleted

## 2020-02-08 VITALS — BP 110/56 | HR 92

## 2020-02-08 DIAGNOSIS — O99212 Obesity complicating pregnancy, second trimester: Secondary | ICD-10-CM

## 2020-02-08 DIAGNOSIS — Z3A23 23 weeks gestation of pregnancy: Secondary | ICD-10-CM | POA: Diagnosis not present

## 2020-02-08 DIAGNOSIS — Z362 Encounter for other antenatal screening follow-up: Secondary | ICD-10-CM | POA: Insufficient documentation

## 2020-02-08 DIAGNOSIS — Z6841 Body Mass Index (BMI) 40.0 and over, adult: Secondary | ICD-10-CM

## 2020-02-11 ENCOUNTER — Other Ambulatory Visit: Payer: Self-pay | Admitting: *Deleted

## 2020-02-11 DIAGNOSIS — Z6841 Body Mass Index (BMI) 40.0 and over, adult: Secondary | ICD-10-CM

## 2020-02-14 ENCOUNTER — Inpatient Hospital Stay (HOSPITAL_COMMUNITY)
Admission: AD | Admit: 2020-02-14 | Discharge: 2020-02-14 | Disposition: A | Discharge status: Home / Self Care | Payer: BC Managed Care – PPO | Attending: Obstetrics and Gynecology | Admitting: Obstetrics and Gynecology

## 2020-02-14 ENCOUNTER — Other Ambulatory Visit: Payer: Self-pay

## 2020-02-14 ENCOUNTER — Encounter (HOSPITAL_COMMUNITY): Payer: Self-pay | Admitting: Obstetrics and Gynecology

## 2020-02-14 ENCOUNTER — Telehealth (INDEPENDENT_AMBULATORY_CARE_PROVIDER_SITE_OTHER): Payer: BC Managed Care – PPO | Admitting: Obstetrics and Gynecology

## 2020-02-14 ENCOUNTER — Encounter: Payer: Self-pay | Admitting: Obstetrics and Gynecology

## 2020-02-14 VITALS — BP 148/88 | Wt 284.0 lb

## 2020-02-14 DIAGNOSIS — M7989 Other specified soft tissue disorders: Secondary | ICD-10-CM | POA: Insufficient documentation

## 2020-02-14 DIAGNOSIS — O26892 Other specified pregnancy related conditions, second trimester: Secondary | ICD-10-CM | POA: Diagnosis not present

## 2020-02-14 DIAGNOSIS — R030 Elevated blood-pressure reading, without diagnosis of hypertension: Secondary | ICD-10-CM | POA: Insufficient documentation

## 2020-02-14 DIAGNOSIS — O162 Unspecified maternal hypertension, second trimester: Secondary | ICD-10-CM

## 2020-02-14 DIAGNOSIS — Z3A24 24 weeks gestation of pregnancy: Secondary | ICD-10-CM

## 2020-02-14 DIAGNOSIS — Z7982 Long term (current) use of aspirin: Secondary | ICD-10-CM | POA: Insufficient documentation

## 2020-02-14 DIAGNOSIS — O99891 Other specified diseases and conditions complicating pregnancy: Secondary | ICD-10-CM | POA: Diagnosis not present

## 2020-02-14 DIAGNOSIS — O99211 Obesity complicating pregnancy, first trimester: Secondary | ICD-10-CM

## 2020-02-14 DIAGNOSIS — Z3400 Encounter for supervision of normal first pregnancy, unspecified trimester: Secondary | ICD-10-CM | POA: Diagnosis not present

## 2020-02-14 LAB — COMPREHENSIVE METABOLIC PANEL
ALT: 21 U/L (ref 0–44)
AST: 16 U/L (ref 15–41)
Albumin: 3 g/dL — ABNORMAL LOW (ref 3.5–5.0)
Alkaline Phosphatase: 71 U/L (ref 38–126)
Anion gap: 9 (ref 5–15)
BUN: <5 mg/dL — ABNORMAL LOW (ref 6–20)
CO2: 24 mmol/L (ref 22–32)
Calcium: 9.5 mg/dL (ref 8.9–10.3)
Chloride: 105 mmol/L (ref 98–111)
Creatinine, Ser: 0.51 mg/dL (ref 0.44–1.00)
GFR, Estimated: >60 mL/min (ref >60)
Glucose, Bld: 84 mg/dL (ref 70–99)
Potassium: 4.1 mmol/L (ref 3.5–5.1)
Sodium: 138 mmol/L (ref 135–145)
Total Bilirubin: 0.4 mg/dL (ref 0.3–1.2)
Total Protein: 6.9 g/dL (ref 6.5–8.1)

## 2020-02-14 LAB — URINALYSIS, ROUTINE W REFLEX MICROSCOPIC
Bilirubin Urine: NEGATIVE
Glucose, UA: NEGATIVE mg/dL
Hgb urine dipstick: NEGATIVE
Ketones, ur: NEGATIVE mg/dL
Nitrite: NEGATIVE
Protein, ur: NEGATIVE mg/dL
Specific Gravity, Urine: 1.023 (ref 1.005–1.030)
WBC, UA: >50 WBC/hpf — ABNORMAL HIGH (ref 0–5)
pH: 5 (ref 5.0–8.0)

## 2020-02-14 LAB — CBC
HCT: 33.8 % — ABNORMAL LOW (ref 36.0–46.0)
Hemoglobin: 11.4 g/dL — ABNORMAL LOW (ref 12.0–15.0)
MCH: 30.6 pg (ref 26.0–34.0)
MCHC: 33.7 g/dL (ref 30.0–36.0)
MCV: 90.9 fL (ref 80.0–100.0)
Platelets: 324 10*3/uL (ref 150–400)
RBC: 3.72 MIL/uL — ABNORMAL LOW (ref 3.87–5.11)
RDW: 14.6 % (ref 11.5–15.5)
WBC: 6.8 10*3/uL (ref 4.0–10.5)
nRBC: 0 % (ref 0.0–0.2)

## 2020-02-14 LAB — PROTEIN / CREATININE RATIO, URINE
Creatinine, Urine: 193.83 mg/dL
Protein Creatinine Ratio: 0.11 mg/mg{Cre} (ref 0.00–0.15)
Total Protein, Urine: 21 mg/dL

## 2020-02-14 NOTE — Patient Instructions (Signed)

## 2020-02-14 NOTE — MAU Provider Note (Signed)
History     [id number]  Arrival date and time: 02/14/20 1654    Chief Complaint  Patient presents with  . Hypertension     HPI April [name] is a 29 y.o. at 4w5dby LMP, who presents for evaluation of elevated blood pressures.   [facility] At that time reported an RN at her job had taken her BP and it was 150/80 Recheck a few hours later at work was 148/88 Sent to MAU for evaluation   Here she reports always gets high BP readings at home, but always normal in the office Confirms above history Denies headache, vision changes, chest pain, shortness of breath, RUQ pain Does think her feet are a little swollen Denies vaginal bleeding, loss of fluid, contractions Has normal fetal movement No personal or family hxo fhigh blood pressure   O/Positive/-- (09/16 1045)  OB History    Gravida  1   Para  0   Term  0   Preterm  0   AB  0   Living  0     SAB  0   IAB  0   Ectopic  0   Multiple  0   Live Births              Past Medical History:  Diagnosis Date  . Medical history non-contributory     Past Surgical History:  Procedure Laterality Date  . NO PAST SURGERIES    . WISDOM TOOTH EXTRACTION  10/2018    Family History  Problem Relation Age of Onset  . Diabetes Father   . Heart disease Maternal Grandmother   . Lung cancer Paternal Grandfather        smoker    Social History   Socioeconomic History  . Marital status: Married    Spouse name: Not on file  . Number of children: 0  . Years of education: Not on file  . Highest education level: Not on file  Occupational History  . Occupation: Student    Comment: [employer] full time  Tobacco Use  . Smoking status: Never Smoker  . Smokeless tobacco: Never Used  Vaping Use  . Vaping Use: Never used  Substance and Sexual Activity  . Alcohol use: Not Currently    Alcohol/week: 0.0 standard drinks    Comment: social  .  Drug use: No  . Sexual activity: Not Currently    Partners: Male    Birth control/protection: I.U.D.  Other Topics Concern  . Not on file  Social History Narrative  . Not on file   Social Determinants of Health   Financial Resource Strain: Not on file  Food Insecurity: No Food Insecurity  . Worried About RCharity fundraiserin the Last Year: Never true  . Ran Out of Food in the Last Year: Never true  Transportation Needs: Not on file  Physical Activity: Not on file  Stress: Not on file  Social Connections: Not on file  Intimate Partner Violence: Not on file    No Known Allergies  No current facility-administered medications on file prior to encounter.   Current Outpatient Medications on File Prior to Encounter  Medication Sig Dispense Refill  . aspirin 81 MG chewable tablet Chew 1 tablet (81 mg total) by mouth daily. 30 tablet 6  . Blood Pressure Monitoring (BLOOD PRESSURE KIT) DEVI 1 kit by Does not apply route as needed. 1 each 0  . Prenatal Vit-Fe Fumarate-FA (  MULTIVITAMIN-PRENATAL) 27-0.8 MG TABS tablet Take 1 tablet by mouth daily at 12 noon.    . Vitamin D, Ergocalciferol, (DRISDOL) 1.25 MG (50000 UNIT) CAPS capsule Take 50,000 Units by mouth every 7 (seven) days.    Marland Kitchen albuterol (PROVENTIL HFA;VENTOLIN HFA) 108 (90 Base) MCG/ACT inhaler Inhale 2 puffs into the lungs every 6 (six) hours as needed for wheezing or shortness of breath. (Patient not taking: Reported on 02/07/2019) 1 Inhaler 0  . ibuprofen (ADVIL,MOTRIN) 800 MG tablet Take 1 tablet (800 mg total) by mouth every 8 (eight) hours as needed. (Patient not taking: Reported on 02/07/2019) 30 tablet 5  . Levonorgestrel (KYLEENA) 19.5 MG IUD by Intrauterine route. (Patient not taking: Reported on 09/24/2019)       ROS Pertinent positives and negative per HPI, all others reviewed and negative  Physical Exam   BP 121/67   Pulse 86   Temp 98.6 F (37 C) (Oral)   Resp 18   LMP 08/25/2019   SpO2 99%   Physical  Exam Vitals reviewed.  Constitutional:      General: She is not in acute distress.    Appearance: She is well-developed and well-nourished. She is not diaphoretic.  Eyes:     General: No scleral icterus. Pulmonary:     Effort: Pulmonary effort is normal. No respiratory distress.  Abdominal:     General: There is no distension.     Palpations: Abdomen is soft.     Tenderness: There is no abdominal tenderness. There is no guarding or rebound.  Musculoskeletal:        General: No edema.  Skin:    General: Skin is warm and dry.  Neurological:     Mental Status: She is alert.     Coordination: Coordination normal.  Psychiatric:        Mood and Affect: Mood and affect normal.      Cervical Exam    Bedside Ultrasound Not done  My interpretation: n/a  FHT Baseline 145, moderate variability, no accels, no decels Toco: none Cat: I  Labs Results for orders placed or performed during the hospital encounter of 02/14/20 (from the past 24 hour(s))  Urinalysis, Routine w reflex microscopic Urine, Clean Catch     Status: Abnormal   Collection Time: 02/14/20  5:33 PM  Result Value Ref Range   Color, Urine YELLOW YELLOW   APPearance CLOUDY (A) CLEAR   Specific Gravity, Urine 1.023 1.005 - 1.030   pH 5.0 5.0 - 8.0   Glucose, UA NEGATIVE NEGATIVE mg/dL   Hgb urine dipstick NEGATIVE NEGATIVE   Bilirubin Urine NEGATIVE NEGATIVE   Ketones, ur NEGATIVE NEGATIVE mg/dL   Protein, ur NEGATIVE NEGATIVE mg/dL   Nitrite NEGATIVE NEGATIVE   Leukocytes,Ua LARGE (A) NEGATIVE   RBC / HPF 0-5 0 - 5 RBC/hpf   WBC, UA >50 (H) 0 - 5 WBC/hpf   Bacteria, UA FEW (A) NONE SEEN   Squamous Epithelial / LPF 11-20 0 - 5   Mucus PRESENT   Protein / creatinine ratio, urine     Status: None   Collection Time: 02/14/20  5:33 PM  Result Value Ref Range   Creatinine, Urine 193.83 mg/dL   Total Protein, Urine 21 mg/dL   Protein Creatinine Ratio 0.11 0.00 - 0.15 mg/mg[Cre]  CBC     Status: Abnormal    Collection Time: 02/14/20  6:35 PM  Result Value Ref Range   WBC 6.8 4.0 - 10.5 K/uL   RBC 3.72 (L) 3.87 -  5.11 MIL/uL   Hemoglobin 11.4 (L) 12.0 - 15.0 g/dL   HCT 33.8 (L) 36.0 - 46.0 %   MCV 90.9 80.0 - 100.0 fL   MCH 30.6 26.0 - 34.0 pg   MCHC 33.7 30.0 - 36.0 g/dL   RDW 14.6 11.5 - 15.5 %   Platelets 324 150 - 400 K/uL   nRBC 0.0 0.0 - 0.2 %  Comprehensive metabolic panel     Status: Abnormal   Collection Time: 02/14/20  6:35 PM  Result Value Ref Range   Sodium 138 135 - 145 mmol/L   Potassium 4.1 3.5 - 5.1 mmol/L   Chloride 105 98 - 111 mmol/L   CO2 24 22 - 32 mmol/L   Glucose, Bld 84 70 - 99 mg/dL   BUN <5 (L) 6 - 20 mg/dL   Creatinine, Ser 0.51 0.44 - 1.00 mg/dL   Calcium 9.5 8.9 - 10.3 mg/dL   Total Protein 6.9 6.5 - 8.1 g/dL   Albumin 3.0 (L) 3.5 - 5.0 g/dL   AST 16 15 - 41 U/L   ALT 21 0 - 44 U/L   Alkaline Phosphatase 71 38 - 126 U/L   Total Bilirubin 0.4 0.3 - 1.2 mg/dL   GFR, Estimated >60 >60 mL/min   Anion gap 9 5 - 15    Imaging No results found.  MAU Course  Procedures  Lab Orders     Urinalysis, Routine w reflex microscopic     CBC     Comprehensive metabolic panel     Protein / creatinine ratio, urine No orders of the defined types were placed in this encounter.  Imaging Orders  No imaging studies ordered today    MDM moderate  Assessment and Plan  #Elevated BP w/o diagnosis of HTN BP's all normal and PreE labs unremarkable. Patient asymptomatic. Reports she sometimes takes BP's after not resting, may be some element of recording error. Will message [facility] to schedule 1 wk BP check in person and instructed patient to bring cuff for troubleshooting.   D/c to home in stable condition.  #FWB FHT Cat I NST: n/a due to gestational age  Clarnce Flock

## 2020-02-14 NOTE — Progress Notes (Signed)
MY CHART VIDEO VIRTUAL OBSTETRICS VISIT ENCOUNTER NOTE  I connected with [name] on 02/14/20 at  3:50 PM EST by My Chart video at work and verified that I am speaking with the correct person using two identifiers. Provider located at [employer] for [employer] at [place].   I discussed the limitations, risks, security and privacy concerns of performing an evaluation and management service by My Chart video and the availability of in person appointments. I also discussed with the patient that there may be a patient responsible charge related to this service. The patient expressed understanding and agreed to proceed.  Subjective:  April [name] is a 29 y.o. G1P0000 at 105w5d being followed for ongoing prenatal care.  She is currently monitored for the following issues for this low-risk pregnancy and has Dysmenorrhea; Vitamin D deficiency; Supervision of normal first pregnancy, antepartum; and Obesity affecting pregnancy in first trimester on their problem list.  Patient reports swelling BLE and some pelvic pain. Reports fetal movement. Denies any contractions, bleeding or leaking of fluid.   The following portions of the patient's history were reviewed and updated as appropriate: allergies, current medications, past family history, past medical history, past social history, past surgical history and problem list.   Objective:   General:  Alert, oriented and cooperative.   Mental Status: Normal mood and affect perceived. Normal judgment and thought content.  Rest of physical exam deferred due to type of encounter  First BP at noon (taken by RN at work): 150/80 BP (!) 148/88   Wt 284 lb (128.8 kg)   LMP 08/25/2019   BMI 48.75 kg/m  **Done by patient's own at home BP cuff and scale  Assessment and Plan:  Pregnancy: G1P0000 at [redacted]w[redacted]d  1. Supervision of normal first pregnancy, antepartum - Anticipatory guidance for 2 hr GTT - advised to fast after midnight  without anything to eat or drink (except for water), will have fasting blood drawn, drink the glucola drink (flavor choices: orange, fruit punch or lemon-lime), have a visit with a provider during the first hour of testing, wait in the lab waiting room to have blood drawn at 1 hour and then 2 hours after finishing glucola drink.  2. Obesity affecting pregnancy in first trimester - Taking bASA daily  3. Elevated blood pressure affecting pregnancy in second trimester, antepartum - Discussed the seriousness of elevated BP and the effects on pregnancy - Instructed to go to MAU for PEC w/u - Luna Kitchens, CNM notified of patient coming for evaluation  4. [redacted] weeks gestation of pregnancy    Preterm labor symptoms and general obstetric precautions including but not limited to vaginal bleeding, contractions, leaking of fluid and fetal movement were reviewed in detail with the patient.  I discussed the assessment and treatment plan with the patient. The patient was provided an opportunity to ask questions and all were answered. The patient agreed with the plan and demonstrated an understanding of the instructions. The patient was advised to call back or seek an in-person office evaluation/go to MAU at [facility] for any urgent or concerning symptoms. Please refer to After Visit Summary for other counseling recommendations.   I provided 10 minutes of non-face-to-face time during this encounter. There was 5 minutes of chart review time spent prior to this encounter. Total time spent = 15 minutes.  Return in about 4 weeks (around 03/13/2020) for Return OB 2hr GTT.  Future Appointments  Date Time Provider Department Center  03/12/2020  8:30 AM Gerrit Heck, CNM [facility] None  03/13/2020  3:15 PM [facility] NURSE [facility]  03/13/2020  3:30 PM [facility] US3 [facility]    Jamelah Sitzer Arita Miss, CNM Center for [employer], [facility]

## 2020-02-14 NOTE — Progress Notes (Deleted)
   MY CHART VIDEO VIRTUAL OBSTETRICS VISIT ENCOUNTER NOTE  I connected with [name] on 02/14/20 at  3:50 PM EST by My Chart video at home and verified that I am speaking with the correct person using two identifiers. Provider located at [facility] for [employer] at ***.   I discussed the limitations, risks, security and privacy concerns of performing an evaluation and management service by My Chart video and the availability of in person appointments. I also discussed with the patient that there may be a patient responsible charge related to this service. The patient expressed understanding and agreed to proceed.  Subjective:  April [name] is a 29 y.o. G1P0000 at [redacted]w[redacted]d being followed for ongoing prenatal care.  She is currently monitored for the following issues for this {Blank single:19197::"high-risk","low-risk"} pregnancy and has Dysmenorrhea; Vitamin D deficiency; Supervision of normal first pregnancy, antepartum; and Obesity affecting pregnancy in first trimester on their problem list.  Patient reports {[id number]}. Reports fetal movement. Denies any contractions, bleeding or leaking of fluid.   The following portions of the patient's history were reviewed and updated as appropriate: allergies, current medications, past family history, past medical history, past social history, past surgical history and problem list.   Objective:   General:  Alert, oriented and cooperative.   Mental Status: Normal mood and affect perceived. Normal judgment and thought content.  Rest of physical exam deferred due to type of encounter  BP (!) 148/88   Wt 284 lb (128.8 kg)   LMP 08/25/2019   BMI 48.75 kg/m  **Done by patient's own at home BP cuff and scale  Assessment and Plan:  Pregnancy: G1P0000 at [redacted]w[redacted]d  There are no diagnoses linked to this encounter. {Blank single:19197::"Term","Preterm"} labor symptoms and general obstetric precautions including but not limited to  vaginal bleeding, contractions, leaking of fluid and fetal movement were reviewed in detail with the patient.  I discussed the assessment and treatment plan with the patient. The patient was provided an opportunity to ask questions and all were answered. The patient agreed with the plan and demonstrated an understanding of the instructions. The patient was advised to call back or seek an in-person office evaluation/go to MAU at [facility] for any urgent or concerning symptoms. Please refer to After Visit Summary for other counseling recommendations.   I provided *** minutes of non-face-to-face time during this encounter. There was *** minutes of chart review time spent prior to this encounter. Total time spent = *** minutes.  No follow-ups on file.  Future Appointments  Date Time Provider Department Center  03/13/2020  3:15 PM [facility] NURSE [facility]  03/13/2020  3:30 PM [facility] US3 [facility]    Raelyn Mora, CNM Center for [employer], [facility]

## 2020-02-14 NOTE — MAU Note (Signed)
Had 2 elevated BPs today.  Had video visit, new problem. Instructed to come in.  Denies HA, visual changes, epigastric pain, states ankles are a little swollen. Denies pain, bleeding or LOF, reports +FM.

## 2020-02-20 ENCOUNTER — Other Ambulatory Visit: Payer: Self-pay

## 2020-02-20 ENCOUNTER — Ambulatory Visit (INDEPENDENT_AMBULATORY_CARE_PROVIDER_SITE_OTHER): Payer: BC Managed Care – PPO | Admitting: *Deleted

## 2020-02-20 VITALS — BP 100/70 | HR 104 | Temp 98.0°F

## 2020-02-20 DIAGNOSIS — Z0130 Encounter for examination of blood pressure without abnormal findings: Secondary | ICD-10-CM

## 2020-02-20 NOTE — Progress Notes (Signed)
   Subjective:  April [name] is a 29 y.o. female here for BP check.   Hypertension ROS: home BP monitoring in range of 150's systolic over 60's diastolic, no TIA's, no chest pain on exertion, no dyspnea on exertion, no swelling of ankles, no orthostatic dizziness or lightheadedness, no orthopnea or paroxysmal nocturnal dyspnea and no palpitations.    Objective:  BP 100/70 (BP Location: Right Arm, Patient Position: Sitting, Cuff Size: Large)   Pulse (!) 104   Temp 98 F (36.7 C) (Oral)   LMP 08/25/2019   Appearance alert, well appearing, and in no distress, oriented to person, place, and time and overweight. General exam BP noted to be well controlled today in office.   Patient brought in her blood pressure monitor from.  BP reading: 129/80, heart rate 103   Assessment:   Blood Pressure well controlled, stable and asymptomatic.   Plan:  Current treatment plan is effective, no change in therapy.   Clovis Pu, RN

## 2020-03-01 NOTE — L&D Delivery Note (Cosign Needed)
OB/GYN Faculty Practice Delivery Note  April [name] is a 29 y.o. G1P0000 s/p vaginal delivery at [redacted]w[redacted]d. She was admitted for IOL in the s/o gestational hypertension.  ROM: 3h 41m with clear fluid GBS Status: negative Maximum Maternal Temperature: 99.2 F  Labor Progress: Patient progressed to induced vaginal delivery. Augmentation with pitocin, AROM. IUPC placed due to difficulty reading contractions and slow cervical change. Nuchal cord present x1.   Delivery Date/Time: 05/19/20 at 1819 Delivery: Called to room and patient was complete and pushing. Head delivered LOA. Nuchal cord present x1, reduced at the time of delivery. Shoulder and body delivered in usual fashion. Infant with spontaneous cry, placed on mother's abdomen, dried and stimulated. Cord clamped x 2 after 1-minute delay, and cut by Grandmother of baby under my direct supervision. Cord blood drawn. Placenta delivered spontaneously with gentle cord traction. Fundus firm with massage and Pitocin. Labia, perineum, vagina, and cervix were inspected, lacerations noted below.   Placenta: Intact, 3 vessel cord Complications: Nuchal cord x1 Lacerations: second degree perineal, repaired EBL: 300 mL Analgesia: Epidural   Postpartum Planning  Infant: Girl  APGARs 9 and 9  TBD g  Henrietta Hoover MD, PGY1    I was present and gloved for entirety of delivery and repair. I agree with the findings and the plan of care as documented in the resident's note.  Casper Harrison, MD [facility] Family Medicine Fellow, [facility] for [facility], [facility]

## 2020-03-12 ENCOUNTER — Ambulatory Visit (INDEPENDENT_AMBULATORY_CARE_PROVIDER_SITE_OTHER): Payer: BC Managed Care – PPO

## 2020-03-12 ENCOUNTER — Other Ambulatory Visit: Payer: Self-pay

## 2020-03-12 VITALS — BP 104/56 | HR 84 | Temp 98.0°F | Wt 295.4 lb

## 2020-03-12 DIAGNOSIS — Z23 Encounter for immunization: Secondary | ICD-10-CM | POA: Diagnosis not present

## 2020-03-12 DIAGNOSIS — Z3A28 28 weeks gestation of pregnancy: Secondary | ICD-10-CM

## 2020-03-12 DIAGNOSIS — Z3400 Encounter for supervision of normal first pregnancy, unspecified trimester: Secondary | ICD-10-CM

## 2020-03-12 NOTE — Patient Instructions (Signed)
Tdap (Tetanus, Diphtheria, Pertussis) Vaccine: What You Need to Know 1. Why get vaccinated? Tdap vaccine can prevent tetanus, diphtheria, and pertussis. Diphtheria and pertussis spread from person to person. Tetanus enters the body through cuts or wounds.  TETANUS (T) causes painful stiffening of the muscles. Tetanus can lead to serious health problems, including being unable to open the mouth, having trouble swallowing and breathing, or death.  DIPHTHERIA (D) can lead to difficulty breathing, heart failure, paralysis, or death.  PERTUSSIS (aP), also known as "whooping cough," can cause uncontrollable, violent coughing that makes it hard to breathe, eat, or drink. Pertussis can be extremely serious especially in babies and young children, causing pneumonia, convulsions, brain damage, or death. In teens and adults, it can cause weight loss, loss of bladder control, passing out, and rib fractures from severe coughing. 2. Tdap vaccine Tdap is only for children 7 years and older, adolescents, and adults.  Adolescents should receive a single dose of Tdap, preferably at age 11 or 12 years. Pregnant people should get a dose of Tdap during every pregnancy, preferably during the early part of the third trimester, to help protect the newborn from pertussis. Infants are most at risk for severe, life-threatening complications from pertussis. Adults who have never received Tdap should get a dose of Tdap. Also, adults should receive a booster dose of either Tdap or Td (a different vaccine that protects against tetanus and diphtheria but not pertussis) every 10 years, or after 5 years in the case of a severe or dirty wound or burn. Tdap may be given at the same time as other vaccines. 3. Talk with your health care provider Tell your vaccine provider if the person getting the vaccine:  Has had an allergic reaction after a previous dose of any vaccine that protects against tetanus, diphtheria, or pertussis, or  has any severe, life-threatening allergies  Has had a coma, decreased level of consciousness, or prolonged seizures within 7 days after a previous dose of any pertussis vaccine (DTP, DTaP, or Tdap)  Has seizures or another nervous system problem  Has ever had Guillain-Barr Syndrome (also called "GBS")  Has had severe pain or swelling after a previous dose of any vaccine that protects against tetanus or diphtheria In some cases, your health care provider may decide to postpone Tdap vaccination until a future visit. People with minor illnesses, such as a cold, may be vaccinated. People who are moderately or severely ill should usually wait until they recover before getting Tdap vaccine.  Your health care provider can give you more information. 4. Risks of a vaccine reaction  Pain, redness, or swelling where the shot was given, mild fever, headache, feeling tired, and nausea, vomiting, diarrhea, or stomachache sometimes happen after Tdap vaccination. People sometimes faint after medical procedures, including vaccination. Tell your provider if you feel dizzy or have vision changes or ringing in the ears.  As with any medicine, there is a very remote chance of a vaccine causing a severe allergic reaction, other serious injury, or death. 5. What if there is a serious problem? An allergic reaction could occur after the vaccinated person leaves the clinic. If you see signs of a severe allergic reaction (hives, swelling of the face and throat, difficulty breathing, a fast heartbeat, dizziness, or weakness), call 9-1-1 and get the person to the nearest hospital. For other signs that concern you, call your health care provider.  Adverse reactions should be reported to the Vaccine Adverse Event Reporting System (VAERS). Your health   care provider will usually file this report, or you can do it yourself. Visit the VAERS website at www.vaers.hhs.gov or call 1-800-822-7967. VAERS is only for reporting  reactions, and VAERS staff members do not give medical advice. 6. The National Vaccine Injury Compensation Program The National Vaccine Injury Compensation Program (VICP) is a federal program that was created to compensate people who may have been injured by certain vaccines. Claims regarding alleged injury or death due to vaccination have a time limit for filing, which may be as short as two years. Visit the VICP website at www.hrsa.gov/vaccinecompensation or call 1-800-338-2382 to learn about the program and about filing a claim. 7. How can I learn more?  Ask your health care provider.  Call your local or state health department.  Visit the website of the Food and Drug Administration (FDA) for vaccine package inserts and additional information at www.fda.gov/vaccines-blood-biologics/vaccines.  Contact the Centers for Disease Control and Prevention (CDC): ? Call 1-800-232-4636 (1-800-CDC-INFO) or ? Visit CDC's website at www.cdc.gov/vaccines. Vaccine Information Statement Tdap (Tetanus, Diphtheria, Pertussis) Vaccine (10/05/2019) This information is not intended to replace advice given to you by your health care provider. Make sure you discuss any questions you have with your health care provider. Document Revised: 10/31/2019 Document Reviewed: 10/31/2019 Elsevier Patient Education  2021 Elsevier Inc.  

## 2020-03-12 NOTE — Progress Notes (Signed)
   LOW-RISK PREGNANCY OFFICE VISIT  Patient name: April [name] MRN [id number]  Date of birth: 1991-01-04 Chief Complaint:   Routine Prenatal Visit  Subjective:   April [name] is a 30 y.o. G67P0000 female at [redacted]w[redacted]d with an Estimated Date of Delivery: 05/31/20 being seen today for ongoing management of a low-risk pregnancy aeb has Dysmenorrhea; Vitamin D deficiency; Supervision of normal first pregnancy, antepartum; and Obesity affecting pregnancy in first trimester on their problem list.  Patient presents today without complaint. Patient endorses fetal movement and denies vaginal concerns including abnormal discharge, leaking of fluid, and bleeding.  Contractions: Not present. Vag. Bleeding: None.  Movement: Present.  Reviewed past medical,surgical, social, obstetrical and family history as well as problem list, medications and allergies.  Objective   Vitals:   03/12/20 0832  BP: (!) 104/56  Pulse: 84  Temp: 98 F (36.7 C)  Weight: 295 lb 6.4 oz (134 kg)  Body mass index is 50.71 kg/m.  Total Weight Gain:15 lb 6.4 oz (6.985 kg)         Physical Examination:   General appearance: Well appearing, and in no distress  Mental status: Alert, oriented to person, place, and time  Skin: Warm & dry  Cardiovascular: Normal heart rate noted  Respiratory: Normal respiratory effort, no distress  Abdomen: Soft, gravid, nontender, AGA with Fundal height of Fundal Height: 30 cm  Pelvic: Cervical exam deferred           Extremities:    Fetal Status: Fetal Heart Rate (bpm): 139  Movement: Present   No results found for this or any previous visit (from the past 24 hour(s)).  Assessment & Plan:  Low-risk pregnancy of a 30 y.o., G1P0000 at [redacted]w[redacted]d with an Estimated Date of Delivery: 05/31/20   1. Supervision of normal first pregnancy, antepartum -Anticipatory guidance for upcoming appts. -Plan to return to office in 2 weeks. -Discussed physiological changes, during pregnancy, that  contribute to back and pelvic pain. -Encouraged continued usage of heat/cold applications for comfort. -Also discussed usage of tylenol.  -Reviewed hospital and labor preparations including 5/1/1 and MAU triage.   2. [redacted] weeks gestation of pregnancy -Doing well. -GTT completed today -Discussed releasing of results and follow up as appropriate.  3. Need for tetanus, diphtheria, and acellular pertussis (Tdap) vaccine in patient of adolescent age or older -Received today. -Discussed recommendation for all caregivers to receive vaccine for infant protection.   4. Need for immunization against influenza -Received today   Meds: No orders of the defined types were placed in this encounter.  Labs/procedures today:  Lab Orders     Glucose Tolerance, 2 Hours w/1 Hour     HIV Antibody (routine testing w rflx)     RPR     CBC   Reviewed: Preterm labor symptoms and general obstetric precautions including but not limited to vaginal bleeding, contractions, leaking of fluid and fetal movement were reviewed in detail with the patient.  All questions were answered.  Follow-up: Return in about 2 weeks (around 03/26/2020) for LROB.  Orders Placed This Encounter  Procedures  . Tdap vaccine greater than or equal to 7yo IM  . Flu Vaccine QUAD 36+ mos IM  . Glucose Tolerance, 2 Hours w/1 Hour  . HIV Antibody (routine testing w rflx)  . RPR  . CBC   Cherre Robins MSN, CNM 03/12/2020

## 2020-03-13 ENCOUNTER — Ambulatory Visit: Payer: BC Managed Care – PPO | Attending: Obstetrics and Gynecology

## 2020-03-13 ENCOUNTER — Ambulatory Visit: Payer: BC Managed Care – PPO | Admitting: *Deleted

## 2020-03-13 ENCOUNTER — Encounter: Payer: Self-pay | Admitting: *Deleted

## 2020-03-13 DIAGNOSIS — E669 Obesity, unspecified: Secondary | ICD-10-CM | POA: Diagnosis not present

## 2020-03-13 DIAGNOSIS — Z362 Encounter for other antenatal screening follow-up: Secondary | ICD-10-CM | POA: Diagnosis not present

## 2020-03-13 DIAGNOSIS — Z6841 Body Mass Index (BMI) 40.0 and over, adult: Secondary | ICD-10-CM | POA: Diagnosis not present

## 2020-03-13 DIAGNOSIS — O99213 Obesity complicating pregnancy, third trimester: Secondary | ICD-10-CM | POA: Diagnosis not present

## 2020-03-13 DIAGNOSIS — Z3A28 28 weeks gestation of pregnancy: Secondary | ICD-10-CM | POA: Diagnosis not present

## 2020-03-13 LAB — CBC
Hematocrit: 33.6 % — ABNORMAL LOW (ref 34.0–46.6)
Hemoglobin: 11.3 g/dL (ref 11.1–15.9)
MCH: 29.8 pg (ref 26.6–33.0)
MCHC: 33.6 g/dL (ref 31.5–35.7)
MCV: 89 fL (ref 79–97)
Platelets: 314 10*3/uL (ref 150–450)
RBC: 3.79 x10E6/uL (ref 3.77–5.28)
RDW: 13.9 % (ref 11.7–15.4)
WBC: 5.6 10*3/uL (ref 3.4–10.8)

## 2020-03-13 LAB — GLUCOSE TOLERANCE, 2 HOURS W/ 1HR
Glucose, 1 hour: 147 mg/dL (ref 65–179)
Glucose, 2 hour: 96 mg/dL (ref 65–152)
Glucose, Fasting: 83 mg/dL (ref 65–91)

## 2020-03-13 LAB — HIV ANTIBODY (ROUTINE TESTING W REFLEX): HIV Screen 4th Generation wRfx: NONREACTIVE

## 2020-03-13 LAB — RPR: RPR Ser Ql: NONREACTIVE

## 2020-03-14 ENCOUNTER — Other Ambulatory Visit: Payer: Self-pay | Admitting: *Deleted

## 2020-03-26 ENCOUNTER — Ambulatory Visit (INDEPENDENT_AMBULATORY_CARE_PROVIDER_SITE_OTHER): Payer: BC Managed Care – PPO

## 2020-03-26 ENCOUNTER — Other Ambulatory Visit: Payer: Self-pay

## 2020-03-26 VITALS — BP 106/72 | HR 100 | Temp 98.3°F | Wt 295.6 lb

## 2020-03-26 DIAGNOSIS — Z3400 Encounter for supervision of normal first pregnancy, unspecified trimester: Secondary | ICD-10-CM

## 2020-03-26 DIAGNOSIS — O99211 Obesity complicating pregnancy, first trimester: Secondary | ICD-10-CM

## 2020-03-26 DIAGNOSIS — Z3A30 30 weeks gestation of pregnancy: Secondary | ICD-10-CM

## 2020-03-26 NOTE — Patient Instructions (Signed)

## 2020-03-26 NOTE — Progress Notes (Signed)
   LOW-RISK PREGNANCY OFFICE VISIT  Patient name: April [name] MRN [id number]  Date of birth: 27-Aug-1990 Chief Complaint:   Routine Prenatal Visit  Subjective:   April [name] is a 30 y.o. G69P0000 female at [redacted]w[redacted]d with an Estimated Date of Delivery: 05/31/20 being seen today for ongoing management of a low-risk pregnancy aeb has Dysmenorrhea; Vitamin D deficiency; Supervision of normal first pregnancy, antepartum; and Obesity affecting pregnancy in first trimester on their problem list.  Patient presents today with complaint of pelvic pressure. Patient endorses fetal movement. Patient denies abdominal cramping or contractions as well as vaginal concerns including abnormal discharge, leaking of fluid, and bleeding. Patient states she will be moving to [place], [place] and questions if she should stay in [place] for delivery. Patient also questions POC for anticipated LGA infant.  Contractions: Not present. Vag. Bleeding: None.  Movement: Present.  Reviewed past medical,surgical, social, obstetrical and family history as well as problem list, medications and allergies.  Objective   Vitals:   03/26/20 1010  BP: 106/72  Pulse: 100  Temp: 98.3 F (36.8 C)  Weight: 295 lb 9.6 oz (134.1 kg)  Body mass index is 50.74 kg/m.  Total Weight Gain:15 lb 9.6 oz (7.076 kg)         Physical Examination:   General appearance: Well appearing, and in no distress  Mental status: Alert, oriented to person, place, and time  Skin: Warm & dry  Cardiovascular: Normal heart rate noted  Respiratory: Normal respiratory effort, no distress  Abdomen: Soft, gravid, nontender, AGA with Fundal height of Fundal Height: 33 cm  Pelvic: Cervical exam deferred           Extremities: Edema: None  Fetal Status: Fetal Heart Rate (bpm): 145  Movement: Present   No results found for this or any previous visit (from the past 24 hour(s)).  Assessment & Plan:  Low-risk pregnancy of a 30 y.o., G1P0000 at  [redacted]w[redacted]d with an Estimated Date of Delivery: 05/31/20   1. Supervision of normal first pregnancy, antepartum -Reviewed labs from previous visit. -Reviewed [place] which shows infant in 89th%ile.  Informed that unless MFM makes recommendation for delivery, plan would be for SOL or PDIOL at 41 weeks. -Informed that unless infant is >99th%ile usually plan for IOL per protocol/as appropriate. -Discussed plan for relocation. Encouraged to discuss with family and make decision best for her life right now.  Informed that close pediatric follow up is necessary after delivery and unforeseen circumstances could arise that may hinder discharge.  Encouraged to consider all possibilities.  2. [redacted] weeks gestation of pregnancy -Doing well overall. -Discussed proper hydration. -Reiterated that pelvic pain is common pregnancy.  3. Obesity affecting pregnancy in first trimester -Taking bASA as prescribed. -TWG 15lbs     Meds: No orders of the defined types were placed in this encounter.  Labs/procedures today:  Lab Orders  No laboratory test(s) ordered today     Reviewed: Preterm labor symptoms and general obstetric precautions including but not limited to vaginal bleeding, contractions, leaking of fluid and fetal movement were reviewed in detail with the patient.  All questions were answered.  Follow-up: Return in about 3 weeks (around 04/16/2020) for Virtual LR-ROB.  No orders of the defined types were placed in this encounter.  Cherre Robins MSN, CNM 03/26/2020

## 2020-03-27 ENCOUNTER — Telehealth: Payer: Self-pay | Admitting: Family Medicine

## 2020-03-27 NOTE — Telephone Encounter (Signed)
Pt reports she has had the covid vaccine and the booster.  Pt will upload her card to mychart or bring with her to next [facility] provider appt to be scanned in.

## 2020-04-10 ENCOUNTER — Other Ambulatory Visit: Payer: Self-pay

## 2020-04-10 ENCOUNTER — Ambulatory Visit: Payer: BC Managed Care – PPO | Attending: Obstetrics and Gynecology

## 2020-04-10 ENCOUNTER — Ambulatory Visit: Payer: BC Managed Care – PPO | Admitting: *Deleted

## 2020-04-10 ENCOUNTER — Other Ambulatory Visit: Payer: Self-pay | Admitting: *Deleted

## 2020-04-10 ENCOUNTER — Encounter: Payer: Self-pay | Admitting: *Deleted

## 2020-04-10 VITALS — BP 125/73 | HR 91

## 2020-04-10 DIAGNOSIS — O99213 Obesity complicating pregnancy, third trimester: Secondary | ICD-10-CM

## 2020-04-10 DIAGNOSIS — Z6841 Body Mass Index (BMI) 40.0 and over, adult: Secondary | ICD-10-CM

## 2020-04-10 DIAGNOSIS — Z362 Encounter for other antenatal screening follow-up: Secondary | ICD-10-CM | POA: Diagnosis not present

## 2020-04-10 DIAGNOSIS — Z3A32 32 weeks gestation of pregnancy: Secondary | ICD-10-CM | POA: Diagnosis not present

## 2020-04-18 ENCOUNTER — Telehealth: Payer: Self-pay | Admitting: *Deleted

## 2020-04-18 ENCOUNTER — Telehealth (INDEPENDENT_AMBULATORY_CARE_PROVIDER_SITE_OTHER): Payer: BC Managed Care – PPO

## 2020-04-18 VITALS — BP 141/70 | HR 97 | Wt 296.0 lb

## 2020-04-18 DIAGNOSIS — O99211 Obesity complicating pregnancy, first trimester: Secondary | ICD-10-CM

## 2020-04-18 DIAGNOSIS — O99213 Obesity complicating pregnancy, third trimester: Secondary | ICD-10-CM

## 2020-04-18 DIAGNOSIS — E669 Obesity, unspecified: Secondary | ICD-10-CM

## 2020-04-18 DIAGNOSIS — Z3400 Encounter for supervision of normal first pregnancy, unspecified trimester: Secondary | ICD-10-CM

## 2020-04-18 DIAGNOSIS — Z3A33 33 weeks gestation of pregnancy: Secondary | ICD-10-CM

## 2020-04-18 NOTE — Progress Notes (Signed)
OBSTETRICS PRENATAL VIRTUAL VISIT ENCOUNTER NOTE  Provider location: [facility]   I connected with [name] on 04/18/20 at  9:10 AM EST by MyChart Video Encounter at home and verified that I am speaking with the correct person using two identifiers.   I discussed the limitations, risks, security and privacy concerns of performing an evaluation and management service virtually and the availability of in person appointments. I also discussed with the patient that there may be a patient responsible charge related to this service. The patient expressed understanding and agreed to proceed. Subjective:  April [name] is a 30 y.o. G1P0000 at [redacted]w[redacted]d being seen today for ongoing prenatal care.  She is currently monitored for the following issues for this low-risk pregnancy and has Dysmenorrhea; Vitamin D deficiency; Supervision of normal first pregnancy, antepartum; and Obesity affecting pregnancy in first trimester on their problem list.  Patient reports no complaints. Patient states she will be relocating to [place], [place] next week, but will remain in [place] with family until after delivery. Patient denies HA or visual disturbances.  Patient endorses fetal movement and denies vaginal concerns or abdominal cramping or contractions.     Contractions: Not present. Vag. Bleeding: None.  Movement: Present. Denies any leaking of fluid.   The following portions of the patient's history were reviewed and updated as appropriate: allergies, current medications, past family history, past medical history, past social history, past surgical history and problem list.   Objective:   Vitals:   04/18/20 0921  BP: (!) 141/70  Pulse: 97  Weight: 296 lb (134.3 kg)    Fetal Status:     Movement: Present     General:  Alert, oriented and cooperative. Patient is in no acute distress.  Respiratory: Normal respiratory effort, no problems with respiration  noted  Mental Status: Normal mood and affect. Normal behavior. Normal judgment and thought content.  Rest of physical exam deferred due to type of encounter  Imaging: [place] MFM OB FOLLOW UP  Result Date: 04/10/2020 ----------------------------------------------------------------------  OBSTETRICS REPORT                       (Signed Final 04/10/2020 04:16 pm) ---------------------------------------------------------------------- Patient Info  ID #:       [id number]                          D.O.B.:  1990-11-12 (30 yrs)  Name:       April [name]                 Visit Date: 04/10/2020 03:27 pm              [place] ---------------------------------------------------------------------- Performed By  Attending:        Ma Rings MD         Secondary Phy.:   Dia Sitter                                                             DAWSON CNM  Performed By:     Tommie Raymond BS,       Address:          [address]  [place], [place]                                                             [address]                                                             [place], [place]                                                             [address]  Referred By:      [facility]        Location:         [facility] ---------------------------------------------------------------------- Orders  #  Description                           Code        Ordered By  1  [place] MFM OB FOLLOW UP                   [phone]    Noralee Space ----------------------------------------------------------------------  #  Order #                     Accession #                Episode #  1  [id number]                   [id number]                 [id number] ----------------------------------------------------------------------  Indications  Obesity complicating pregnancy, third          O99.213  trimester (BMI 48)  [redacted] weeks gestation of pregnancy  Z3A.32  Encounter for other antenatal screening        Z36.2  follow-up ---------------------------------------------------------------------- Fetal Evaluation  Num Of Fetuses:         1  Fetal Heart Rate(bpm):  157  Cardiac Activity:       Observed  Presentation:           Cephalic  Placenta:               Anterior  P. Cord Insertion:      Previously Visualized  Amniotic Fluid  AFI FV:      Within normal limits  AFI Sum(cm)     %Tile       Largest Pocket(cm)  14.6            51          7  RUQ(cm)       RLQ(cm)       LUQ(cm)        LLQ(cm)  4.4           3.2           0              7 ---------------------------------------------------------------------- Biometry  BPD:      84.4  mm     G. Age:  34w 0d         79  %    CI:        76.07   %    70 - 86                                                          FL/HC:      19.9   %    19.9 - 21.5  HC:      306.7  mm     G. Age:  34w 1d         51  %    HC/AC:      0.99        0.96 - 1.11  AC:      309.5  mm     G. Age:  34w 6d         95  %    FL/BPD:     72.2   %    71 - 87  FL:       60.9  mm     G. Age:  31w 4d         14  %    FL/AC:      19.7   %    20 - 24  LV:        6.1  mm  Est. FW:    2289  gm      5 lb 1 oz     76  % ---------------------------------------------------------------------- OB History  Gravidity:    1         Term:   0  Living:       0 ---------------------------------------------------------------------- Gestational Age  LMP:           32w 5d        Date:  08/25/19                 EDD:   05/31/20  U/S Today:  33w 5d                                        EDD:   05/24/20  Best:          32w 5d     Det. By:  LMP  (08/25/19)          EDD:   05/31/20 ---------------------------------------------------------------------- Anatomy  Cranium:               Previously seen        Aortic Arch:            Previously seen   Cavum:                 Previously seen        Ductal Arch:            Previously seen  Ventricles:            Appears normal         Diaphragm:              Appears normal  Choroid Plexus:        Previously seen        Stomach:                Appears normal, left                                                                        sided  Cerebellum:            Previously seen        Abdomen:                Previously seen  Posterior Fossa:       Previously seen        Abdominal Wall:         Previously seen  Nuchal Fold:           Previously seen        Cord Vessels:           Previously seen  Face:                  Orbits and profile     Kidneys:                Appear normal                         previously seen  Lips:                  Previously seen        Bladder:                Appears normal  Thoracic:              Previously seen        Spine:                  Previously seen  Heart:                 Appears  normal         Upper Extremities:      Previously seen                         (4CH, axis, and                         situs)  RVOT:                  Previously seen        Lower Extremities:      Previously seen  LVOT:                  Previously seen  Other:  Technically difficult due to maternal habitus and fetal position. ---------------------------------------------------------------------- Cervix Uterus Adnexa  Cervix  Normal appearance by transabdominal scan.  Uterus  Single fibroid noted, see table below.  Right Ovary  Within normal limits.  Left Ovary  Within normal limits.  Cul De Sac  No free fluid seen.  Adnexa  No abnormality visualized. ---------------------------------------------------------------------- Comments  This patient was seen for a follow up growth scan due to  maternal obesity with a BMI of 48.  She denies any problems  since her last exam and reports that she has screened  negative for gestational diabetes in her current pregnancy.  She was informed that the fetal growth  and amniotic fluid  level appears appropriate for her gestational age.  Fetal body movements and fetal breathing movements were  noted throughout today's exam.  Due to her elevated BMI, we will start weekly fetal testing at  around 36 weeks.  A biophysical profile and growth scan was scheduled at 36  weeks. ----------------------------------------------------------------------                   Ma Rings, MD Electronically Signed Final Report   04/10/2020 04:16 pm ----------------------------------------------------------------------   Assessment and Plan:  Pregnancy: G1P0000 at [redacted]w[redacted]d 1. Supervision of normal first pregnancy, antepartum -Anticipatory guidance for upcoming appts. -Patient to next appt in 2-3 weeks for an in-person visit. -Educated on GBS bacteria including what it is, why we test, and how and when we treat if needed.  2. Obesity affecting pregnancy in first trimester -Taking bASA as prescribed -BP elevated today.  -Patient driving and unable to retake. -Instructed to retake at earliest convenience and if remains elevated to report to MAU for evaluation and PIH work-up.   3. [redacted] weeks gestation of pregnancy -Doing well -Encouraged to obtain pediatrician in area of relocation.  Patient reports she has already secured.    Preterm labor symptoms and general obstetric precautions including but not limited to vaginal bleeding, contractions, leaking of fluid and fetal movement were reviewed in detail with the patient. I discussed the assessment and treatment plan with the patient. The patient was provided an opportunity to ask questions and all were answered. The patient agreed with the plan and demonstrated an understanding of the instructions. The patient was advised to call back or seek an in-person office evaluation/go to MAU at [facility] for any urgent or concerning symptoms. Please refer to After Visit Summary for other counseling recommendations.   I  provided 7 minutes of face-to-face time during this encounter.  Return in about 3 weeks (around 05/09/2020) for LROB with GBS.  Future Appointments  Date Time Provider Department Center  05/08/2020  3:00 PM [facility] NURSE [facility]  05/08/2020  3:15 PM [facility] US2 [facility]  05/15/2020  1:45 PM [facility] NURSE [facility]  05/15/2020  2:00 PM [facility] US1 [facility]  05/22/2020  1:45 PM [facility] NURSE [facility]  05/22/2020  2:00 PM [facility] US1 [facility]  05/29/2020  1:45 PM [facility] NURSE [facility]  05/29/2020  2:00 PM [facility] US1 [facility]    Cherre Robins, CNM Center for [employer], [facility]

## 2020-04-18 NOTE — Telephone Encounter (Signed)
Patient was called regarding blood pressure today 141/70. Per Shanda Bumps, patient should recheck blood pressure once she is home and settled. If BP is still elevated to go to MAU. Patient should send nurse a Mychart reading of BP if home before noon. If patient is not home before noon, go to MAU.  Clovis Pu, RN

## 2020-04-18 NOTE — Patient Instructions (Signed)

## 2020-04-23 ENCOUNTER — Telehealth: Payer: BC Managed Care – PPO | Admitting: Student

## 2020-05-08 ENCOUNTER — Ambulatory Visit: Payer: BC Managed Care – PPO | Admitting: *Deleted

## 2020-05-08 ENCOUNTER — Ambulatory Visit: Payer: BC Managed Care – PPO | Attending: Obstetrics

## 2020-05-08 ENCOUNTER — Encounter: Payer: Self-pay | Admitting: *Deleted

## 2020-05-08 ENCOUNTER — Other Ambulatory Visit: Payer: Self-pay

## 2020-05-08 DIAGNOSIS — O99213 Obesity complicating pregnancy, third trimester: Secondary | ICD-10-CM | POA: Diagnosis not present

## 2020-05-08 DIAGNOSIS — Z3A36 36 weeks gestation of pregnancy: Secondary | ICD-10-CM

## 2020-05-08 DIAGNOSIS — E669 Obesity, unspecified: Secondary | ICD-10-CM | POA: Diagnosis not present

## 2020-05-08 DIAGNOSIS — Z6841 Body Mass Index (BMI) 40.0 and over, adult: Secondary | ICD-10-CM | POA: Diagnosis present

## 2020-05-08 DIAGNOSIS — Z362 Encounter for other antenatal screening follow-up: Secondary | ICD-10-CM

## 2020-05-09 ENCOUNTER — Other Ambulatory Visit (HOSPITAL_COMMUNITY)
Admission: RE | Admit: 2020-05-09 | Discharge: 2020-05-09 | Disposition: A | Discharge status: Home / Self Care | Payer: BC Managed Care – PPO | Source: Ambulatory Visit

## 2020-05-09 ENCOUNTER — Ambulatory Visit (INDEPENDENT_AMBULATORY_CARE_PROVIDER_SITE_OTHER): Payer: BC Managed Care – PPO

## 2020-05-09 VITALS — BP 131/81 | HR 94 | Wt 308.1 lb

## 2020-05-09 DIAGNOSIS — Z3A36 36 weeks gestation of pregnancy: Secondary | ICD-10-CM | POA: Diagnosis present

## 2020-05-09 DIAGNOSIS — O99211 Obesity complicating pregnancy, first trimester: Secondary | ICD-10-CM

## 2020-05-09 DIAGNOSIS — Z3400 Encounter for supervision of normal first pregnancy, unspecified trimester: Secondary | ICD-10-CM

## 2020-05-09 NOTE — Progress Notes (Signed)
+  fetal movements

## 2020-05-09 NOTE — Progress Notes (Signed)
   LOW-RISK PREGNANCY OFFICE VISIT  Patient name: April [name] MRN [id number]  Date of birth: 01-05-1991 Chief Complaint:   Routine Prenatal Visit  Subjective:   Geralyn Figiel is a 30 y.o. G38P0000 female at [redacted]w[redacted]d with an Estimated Date of Delivery: 05/31/20 being seen today for ongoing management of a low-risk pregnancy aeb has Dysmenorrhea; Vitamin D deficiency; Supervision of normal first pregnancy, antepartum; and Obesity affecting pregnancy in first trimester on their problem list.  Patient presents today, with her mother, and is without complaint. However, patient's mother expresses concern with pedal edema. Patient endorses fetal movement. Patient denies vaginal concerns including abnormal discharge, leaking of fluid, and bleeding. Patient denies abdominal cramping or contractions.  Contractions: Not present. Vag. Bleeding: None.  Movement: Present.  Reviewed past medical,surgical, social, obstetrical and family history as well as problem list, medications and allergies.  Objective   Vitals:   05/09/20 0839  BP: 131/81  Pulse: 94  Weight: (!) 308 lb 1.6 oz (139.8 kg)  Body mass index is 52.89 kg/m.  Total Weight Gain:28 lb 1.6 oz (12.7 kg)         Physical Examination:   General appearance: Well appearing, and in no distress  Mental status: Alert, oriented to person, place, and time  Skin: Warm & dry  Cardiovascular: Normal heart rate noted  Respiratory: Normal respiratory effort, no distress  Abdomen: Soft, gravid, nontender, AGA with Fundal height of Fundal Height: 38 cm  Pelvic: Cervical exam deferred           Extremities: Edema: Mild pitting, slight indentation  Fetal Status: Fetal Heart Rate (bpm): 141  Movement: Present   No results found for this or any previous visit (from the past 24 hour(s)).  Assessment & Plan:  Low-risk pregnancy of a 30 y.o., G1P0000 at [redacted]w[redacted]d with an Estimated Date of Delivery: 05/31/20   1. Supervision of normal first  pregnancy, antepartum -Anticipatory guidance for upcoming appts. -Patient to next appt in 1 weeks for an in-person visit. -Reassured that swelling during pregnancy is normal. -Encouraged elevation and discussed expected course of swelling during pregnancy and throughout postpartum period.. -Educated on GBS bacteria including what it is, why we test, and how and when we treat if needed. -GBS swab collected and pending.   2. Obesity affecting pregnancy in first trimester -Taking baby ASA. -TWG 28lbs  3. [redacted] weeks gestation of pregnancy -Doing well. -Encouraged to start to consider labor expectations and plans. -Patient's mother informed that provider would not give note to work from home as she has no medical indication. -Explained that pregnancy is not a disability and encouraged to work for as long as possible.  -Briefly discussed perineal stretching.  Discussed usage of coconut or massage oil to stretch perineal area daily.   Meds: No orders of the defined types were placed in this encounter.  Labs/procedures today:   Lab Orders     Culture, beta strep (group b only)   Reviewed: Term labor symptoms and general obstetric precautions including but not limited to vaginal bleeding, contractions, leaking of fluid and fetal movement were reviewed in detail with the patient.  All questions were answered.  Follow-up: Return in about 1 week (around 05/16/2020) for LROB.  Orders Placed This Encounter  Procedures  . Culture, beta strep (group b only)   Cherre Robins MSN, CNM 05/09/2020

## 2020-05-09 NOTE — Patient Instructions (Signed)

## 2020-05-11 LAB — GC/CHLAMYDIA PROBE AMP (~~LOC~~) NOT AT ARMC
Chlamydia: NEGATIVE
Comment: NEGATIVE
Comment: NORMAL
Neisseria Gonorrhea: NEGATIVE

## 2020-05-13 LAB — CULTURE, BETA STREP (GROUP B ONLY): Strep Gp B Culture: NEGATIVE

## 2020-05-15 ENCOUNTER — Encounter: Payer: Self-pay | Admitting: *Deleted

## 2020-05-15 ENCOUNTER — Ambulatory Visit (HOSPITAL_BASED_OUTPATIENT_CLINIC_OR_DEPARTMENT_OTHER): Payer: BC Managed Care – PPO

## 2020-05-15 ENCOUNTER — Other Ambulatory Visit: Payer: Self-pay

## 2020-05-15 ENCOUNTER — Ambulatory Visit: Payer: BC Managed Care – PPO | Admitting: *Deleted

## 2020-05-15 DIAGNOSIS — Z3A37 37 weeks gestation of pregnancy: Secondary | ICD-10-CM | POA: Diagnosis not present

## 2020-05-15 DIAGNOSIS — O288 Other abnormal findings on antenatal screening of mother: Secondary | ICD-10-CM

## 2020-05-15 DIAGNOSIS — E669 Obesity, unspecified: Secondary | ICD-10-CM

## 2020-05-15 DIAGNOSIS — Z6841 Body Mass Index (BMI) 40.0 and over, adult: Secondary | ICD-10-CM | POA: Insufficient documentation

## 2020-05-15 DIAGNOSIS — O99213 Obesity complicating pregnancy, third trimester: Secondary | ICD-10-CM

## 2020-05-16 ENCOUNTER — Ambulatory Visit (INDEPENDENT_AMBULATORY_CARE_PROVIDER_SITE_OTHER): Payer: BC Managed Care – PPO

## 2020-05-16 VITALS — BP 120/84 | HR 105 | Temp 98.0°F | Wt 311.8 lb

## 2020-05-16 DIAGNOSIS — Z3A37 37 weeks gestation of pregnancy: Secondary | ICD-10-CM

## 2020-05-16 DIAGNOSIS — Z3400 Encounter for supervision of normal first pregnancy, unspecified trimester: Secondary | ICD-10-CM

## 2020-05-16 DIAGNOSIS — O288 Other abnormal findings on antenatal screening of mother: Secondary | ICD-10-CM

## 2020-05-16 DIAGNOSIS — O133 Gestational [pregnancy-induced] hypertension without significant proteinuria, third trimester: Secondary | ICD-10-CM | POA: Insufficient documentation

## 2020-05-16 NOTE — Patient Instructions (Signed)
Labor Induction Labor induction is when steps are taken to cause a pregnant woman to begin the labor process. Most women go into labor on their own between 37 weeks and 42 weeks of pregnancy. When this does not happen, or when there is a medical need for labor to begin, steps may be taken to induce, or bring on, labor. Labor induction causes a pregnant woman's uterus to contract. It also causes the cervix to soften (ripen), open (dilate), and thin out. Usually, labor is not induced before 39 weeks of pregnancy unless there is a medical reason to do so. When is labor induction considered? Labor induction may be right for you if:  Your pregnancy lasts longer than 41 to 42 weeks.  Your placenta is separating from your uterus (placental abruption).  You have a rupture of membranes and your labor does not begin.  You have health problems, like diabetes or high blood pressure (preeclampsia) during your pregnancy.  Your baby has stopped growing or does not have enough amniotic fluid. Before labor induction begins, your health care provider will consider the following factors:  Your medical condition and the baby's condition.  How many weeks you have been pregnant.  How mature the baby's lungs are.  The condition of your cervix.  The position of the baby.  The size of your birth canal. Tell a health care provider about:  Any allergies you have.  All medicines you are taking, including vitamins, herbs, eye drops, creams, and over-the-counter medicines.  Any problems you or your family members have had with anesthetic medicines.  Any surgeries you have had.  Any blood disorders you have.  Any medical conditions you have. What are the risks? Generally, this is a safe procedure. However, problems may occur, including:  Failed induction.  Changes in fetal heart rate, such as being too high, too low, or irregular (erratic).  Infection in the mother or the baby.  Increased risk of  having a cesarean delivery.  Breaking off (abruption) of the placenta from the uterus. This is rare.  Rupture of the uterus. This is very rare.  Your baby could fail to get enough blood flow or oxygen. This can be life-threatening. When induction is needed for medical reasons, the benefits generally outweigh the risks. What happens during the procedure? During the procedure, your health care provider will use one of these methods to induce labor:  Stripping the membranes. In this method, the amniotic sac tissue is gently separated from the cervix. This causes the following to happen: ? Your cervix stretches, which in turn causes the release of prostaglandins. ? Prostaglandins induce labor and cause the uterus to contract. ? This procedure is often done in an office visit. You will be sent home to wait for contractions to begin.  Prostaglandin medicine. This medicine starts contractions and causes the cervix to dilate and ripen. This can be taken by mouth (orally) or by being inserted into the vagina (suppository).  Inserting a small, thin tube (catheter) with a balloon into the vagina and then expanding the balloon with water to dilate the cervix.  Breaking the water. In this method, a small instrument is used to make a small hole in the amniotic sac. This eventually causes the amniotic sac to break. Contractions should begin within a few hours.  Medicine to trigger or strengthen contractions. This medicine is given through an IV that is inserted into a vein in your arm. This procedure may vary among health care providers and hospitals.     Where to find more information  March of Dimes: www.marchofdimes.org  The American College of Obstetricians and Gynecologists: www.acog.org Summary  Labor induction causes a pregnant woman's uterus to contract. It also causes the cervix to soften (ripen), open (dilate), and thin out.  Labor is usually not induced before 39 weeks of pregnancy unless  there is a medical reason to do so.  When induction is needed for medical reasons, the benefits generally outweigh the risks.  Talk with your health care provider about which methods of labor induction are right for you. This information is not intended to replace advice given to you by your health care provider. Make sure you discuss any questions you have with your health care provider. Document Revised: 11/29/2019 Document Reviewed: 11/29/2019 Elsevier Patient Education  2021 Elsevier Inc.  

## 2020-05-16 NOTE — Progress Notes (Signed)
LOW-RISK PREGNANCY OFFICE VISIT  Patient name: April [name] MRN [id number]  Date of birth: [birth year] Chief Complaint:   Routine Prenatal Visit  Subjective:   April [name] is a 30 y.o. G59P0000 female at [redacted]w[redacted]d with an Estimated Date of Delivery: 05/31/20 being seen today for ongoing management of a low-risk pregnancy aeb has Dysmenorrhea; Vitamin D deficiency; Supervision of normal first pregnancy, antepartum; Obesity affecting pregnancy in first trimester; and Gestational hypertension, third trimester on their problem list.  Patient presents today with complaint of no complaints. Patient endorses fetal movement and denies vaginal concerns including abnormal discharge, leaking of fluid, and bleeding. She denies abdominal cramping or contractions.  Patient declines vaginal exam today.  Contractions: Not present. Vag. Bleeding: None.  Movement: Present.  Reviewed past medical,surgical, social, obstetrical and family history as well as problem list, medications and allergies.  Objective   Vitals:   05/16/20 1142  BP: 120/84  Pulse: (!) 105  Temp: 98 F (36.7 C)  Weight: (!) 311 lb 12.8 oz (141.4 kg)  Body mass index is 53.52 kg/m.  Total Weight Gain:31 lb 12.8 oz (14.4 kg)         Physical Examination:   General appearance: Well appearing, and in no distress  Mental status: Alert, oriented to person, place, and time  Skin: Warm & dry  Cardiovascular: Normal heart rate noted  Respiratory: Normal respiratory effort, no distress  Abdomen: Soft, gravid, nontender, AGA with  Fundal Height: 43 cm  Pelvic: Cervical exam deferred           Extremities: Edema: Moderate pitting, indentation subsides rapidly  Fetal Status: Fetal Heart Rate (bpm): 143  Movement: Present   [place] MFM FETAL BPP WO NON STRESS  Result Date: 05/15/2020 ----------------------------------------------------------------------  OBSTETRICS REPORT                       (Signed Final 05/15/2020 03:13  pm) ---------------------------------------------------------------------- Patient Info  ID #:       [id number]                          D.O.B.:  1990/12/22 (30 yrs)  Name:       April [name]                 Visit Date: 05/15/2020 02:22 pm              Red ---------------------------------------------------------------------- Performed By  Attending:        Lin Landsman      Secondary Phy.:   Dia Sitter                    MD                                                             DAWSON CNM  Performed By:     Truitt Leep,      Address:          [address]                    RDMS,RDCS  [address]                                                             [place], [place]                                                             [address]  Referred By:      [facility]        [facility] ---------------------------------------------------------------------- Orders  #  Description                           Code        Ordered By  1  [place] MFM FETAL BPP WO NON               76819.01    YU FANG     STRESS ----------------------------------------------------------------------  #  Order #                     Accession #                Episode #  1  [id number]                   [id number]                 [id number] ---------------------------------------------------------------------- Indications  Obesity complicating pregnancy, third          O99.213  trimester (BMI 48)  Encounter for other antenatal screening        Z36.2  follow-up  [redacted] weeks gestation of pregnancy                Z3A.37 ---------------------------------------------------------------------- Fetal Evaluation  Num Of Fetuses:         1  Cardiac Activity:        Observed  Presentation:           Cephalic  Placenta:               Anterior  P. Cord Insertion:      Previously Visualized  Amniotic Fluid  AFI FV:      Subjectively decreased  AFI Sum(cm)     %Tile       Largest Pocket(cm)  6.5             < 3         4.2  RUQ(cm)                     LUQ(cm)  2.3                         4.2  Comment:    Multiple echolucent areas in the placenta surrounded by              echogenic rim. Possible small infarcts. ---------------------------------------------------------------------- Biophysical Evaluation  Amniotic F.V:   Pocket => 2 cm             F. Tone:        Observed  F. Movement:    Observed                   Score:          8/8  F. Breathing:   Observed ---------------------------------------------------------------------- OB History  Gravidity:    1         Term:   0  Living:       0 ---------------------------------------------------------------------- Gestational Age  LMP:           37w 5d        Date:  08/25/19                 EDD:   05/31/20  Best:          37w 5d     Det. By:  LMP  (08/25/19)          EDD:   05/31/20 ---------------------------------------------------------------------- Anatomy  Cranium:               Appears normal         Ductal Arch:            Appears normal  Heart:                 Appears normal         Stomach:                Appears normal, left                         (4CH, axis, and                                sided                         situs)  RVOT:                  Appears normal         Kidneys:                Appear normal  LVOT:                  Appears normal  Bladder:                Appears normal  Aortic Arch:           Appears normal  Other:  Other anatomy previously imaged. ---------------------------------------------------------------------- Impression  Antenatal testing due to elevated maternal BMI.  Biophysical profile is 8/8 with subjectively low amniotic fluid,  but normal measurements.  She reports good fetal  movement  I recommended maternal hydration and repeat amnoitic fluid  volume in 1 week.  If amniotic fluid continues to be low consider delivery  between 38-39 weeks. ----------------------------------------------------------------------               Lin Landsman, MD Electronically Signed Final Report   05/15/2020 03:13 pm ----------------------------------------------------------------------   Assessment & Plan:  Low-risk pregnancy of a 30 y.o., G1P0000 at [redacted]w[redacted]d with an Estimated Date of Delivery: 05/31/20   1. Supervision of normal first pregnancy, antepartum -Instructed to schedule PPV in 5 weeks.   2. [redacted] weeks gestation of pregnancy -Doing well overall. -Taking bASA as prescribed. -BP normotensive today, but elevated yesterday.   3. Gestational hypertension, third trimester -Reviewed blood pressures from office and babyscripts. -Notable elevations throughout pregnancy. -Extensive discussion of diagnosis and why it is being made. -Informed that in setting of low amniotic fluid, it would be best to proceed with induction.   -Reviewed induction procedures -Discussed r/b of induction including fetal distress, serial induction, pain, and increased risk of c/s delivery. -Discussed induction methods including cervical ripening agents, foley bulbs, and pitocin.  4. Amniotic fluid index borderline low -Reviewed ultrasound from yesterday. -Reassured that fluid is low, but adequate for fetus. -Discussed how elevated blood pressures can contribute to low fluid index.   Meds: No orders of the defined types were placed in this encounter.  Labs/procedures today:  Lab Orders  No laboratory test(s) ordered today     Reviewed: Term labor symptoms and general obstetric precautions including but not limited to vaginal bleeding, contractions, leaking of fluid and fetal movement were reviewed in detail with the patient.  All questions were answered.  Follow-up: No follow-ups on file.  No  orders of the defined types were placed in this encounter.  Cherre Robins MSN, CNM 05/16/2020

## 2020-05-17 NOTE — Progress Notes (Signed)
Pt scheduled as a midnight IOL per Dr. Charlotta Newton hold pt until in the morning may call her in when when we have staff and a bed. Pt notified. Answered all questions. Pt notified what things to seek care for in [facility].

## 2020-05-18 ENCOUNTER — Inpatient Hospital Stay (HOSPITAL_COMMUNITY)
Admission: AD | Admit: 2020-05-18 | Discharge: 2020-05-21 | DRG: 807 | Disposition: A | Discharge status: Home / Self Care | Payer: BC Managed Care – PPO | Attending: Obstetrics and Gynecology | Admitting: Obstetrics and Gynecology

## 2020-05-18 ENCOUNTER — Inpatient Hospital Stay (HOSPITAL_COMMUNITY): Payer: BC Managed Care – PPO

## 2020-05-18 ENCOUNTER — Inpatient Hospital Stay (HOSPITAL_COMMUNITY)
Admission: AD | Admit: 2020-05-18 | Payer: BC Managed Care – PPO | Source: Home / Self Care | Admitting: Obstetrics and Gynecology

## 2020-05-18 ENCOUNTER — Other Ambulatory Visit: Payer: Self-pay

## 2020-05-18 ENCOUNTER — Inpatient Hospital Stay (HOSPITAL_COMMUNITY): Payer: BC Managed Care – PPO | Attending: Obstetrics and Gynecology

## 2020-05-18 ENCOUNTER — Encounter (HOSPITAL_COMMUNITY): Payer: Self-pay | Admitting: Family Medicine

## 2020-05-18 DIAGNOSIS — O99214 Obesity complicating childbirth: Secondary | ICD-10-CM | POA: Diagnosis present

## 2020-05-18 DIAGNOSIS — O134 Gestational [pregnancy-induced] hypertension without significant proteinuria, complicating childbirth: Secondary | ICD-10-CM | POA: Diagnosis present

## 2020-05-18 DIAGNOSIS — Z3A38 38 weeks gestation of pregnancy: Secondary | ICD-10-CM | POA: Diagnosis not present

## 2020-05-18 DIAGNOSIS — Z20822 Contact with and (suspected) exposure to covid-19: Secondary | ICD-10-CM | POA: Diagnosis present

## 2020-05-18 DIAGNOSIS — O139 Gestational [pregnancy-induced] hypertension without significant proteinuria, unspecified trimester: Secondary | ICD-10-CM

## 2020-05-18 LAB — COMPREHENSIVE METABOLIC PANEL
ALT: 24 U/L (ref 0–44)
AST: 24 U/L (ref 15–41)
Albumin: 2.8 g/dL — ABNORMAL LOW (ref 3.5–5.0)
Alkaline Phosphatase: 138 U/L — ABNORMAL HIGH (ref 38–126)
Anion gap: 7 (ref 5–15)
BUN: <5 mg/dL — ABNORMAL LOW (ref 6–20)
CO2: 21 mmol/L — ABNORMAL LOW (ref 22–32)
Calcium: 9.2 mg/dL (ref 8.9–10.3)
Chloride: 107 mmol/L (ref 98–111)
Creatinine, Ser: 0.65 mg/dL (ref 0.44–1.00)
GFR, Estimated: >60 mL/min (ref >60)
Glucose, Bld: 100 mg/dL — ABNORMAL HIGH (ref 70–99)
Potassium: 3.7 mmol/L (ref 3.5–5.1)
Sodium: 135 mmol/L (ref 135–145)
Total Bilirubin: 0.5 mg/dL (ref 0.3–1.2)
Total Protein: 6.5 g/dL (ref 6.5–8.1)

## 2020-05-18 LAB — CBC
HCT: 34.5 % — ABNORMAL LOW (ref 36.0–46.0)
Hemoglobin: 11.5 g/dL — ABNORMAL LOW (ref 12.0–15.0)
MCH: 29.6 pg (ref 26.0–34.0)
MCHC: 33.3 g/dL (ref 30.0–36.0)
MCV: 88.9 fL (ref 80.0–100.0)
Platelets: 296 10*3/uL (ref 150–400)
RBC: 3.88 MIL/uL (ref 3.87–5.11)
RDW: 15.1 % (ref 11.5–15.5)
WBC: 4.3 10*3/uL (ref 4.0–10.5)
nRBC: 0 % (ref 0.0–0.2)

## 2020-05-18 LAB — RESP PANEL BY RT-PCR (FLU A&B, COVID) ARPGX2
Influenza A by PCR: NEGATIVE
Influenza B by PCR: NEGATIVE
SARS Coronavirus 2 by RT PCR: NEGATIVE

## 2020-05-18 LAB — TYPE AND SCREEN
ABO/RH(D): O POS
Antibody Screen: NEGATIVE

## 2020-05-18 LAB — PROTEIN / CREATININE RATIO, URINE
Creatinine, Urine: 35.33 mg/dL
Protein Creatinine Ratio: 0.2 mg/mg{Cre} — ABNORMAL HIGH (ref 0.00–0.15)
Total Protein, Urine: 7 mg/dL

## 2020-05-18 LAB — RPR: RPR Ser Ql: NONREACTIVE

## 2020-05-18 MED ORDER — DIPHENHYDRAMINE HCL 50 MG/ML IJ SOLN: 12.5000 mg | INTRAMUSCULAR | Status: DC | PRN

## 2020-05-18 MED ORDER — LIDOCAINE HCL (PF) 1 % IJ SOLN: 30.0000 mL | INTRAMUSCULAR | Status: DC | PRN

## 2020-05-18 MED ORDER — LACTATED RINGERS IV SOLN
500.0000 mL | Freq: Once | INTRAVENOUS | Status: AC
  Administered 2020-05-19: 500 mL via INTRAVENOUS

## 2020-05-18 MED ORDER — LACTATED RINGERS IV SOLN
500.0000 mL | INTRAVENOUS | Status: DC | PRN
  Administered 2020-05-19: 1000 mL via INTRAVENOUS
  Administered 2020-05-19: 500 mL via INTRAVENOUS
  Administered 2020-05-19: 1000 mL via INTRAVENOUS

## 2020-05-18 MED ORDER — ACETAMINOPHEN 325 MG PO TABS: 650.0000 mg | ORAL_TABLET | ORAL | Status: DC | PRN

## 2020-05-18 MED ORDER — MISOPROSTOL 25 MCG QUARTER TABLET
25.0000 ug | ORAL_TABLET | Freq: Once | ORAL | Status: AC
  Administered 2020-05-18: 25 ug via VAGINAL

## 2020-05-18 MED ORDER — FENTANYL-BUPIVACAINE-NACL 0.5-0.125-0.9 MG/250ML-% EP SOLN
12.0000 mL/h | EPIDURAL | Status: DC | PRN
  Administered 2020-05-19: 12 mL/h via EPIDURAL
  Filled 2020-05-18: qty 250

## 2020-05-18 MED ORDER — OXYTOCIN-SODIUM CHLORIDE 30-0.9 UT/500ML-% IV SOLN
2.5000 [IU]/h | INTRAVENOUS | Status: DC
  Filled 2020-05-18: qty 500

## 2020-05-18 MED ORDER — MISOPROSTOL 50MCG HALF TABLET
50.0000 ug | ORAL_TABLET | Freq: Once | ORAL | Status: AC
  Administered 2020-05-18: 50 ug via BUCCAL
  Filled 2020-05-18: qty 1

## 2020-05-18 MED ORDER — SOD CITRATE-CITRIC ACID 500-334 MG/5ML PO SOLN: 30.0000 mL | ORAL | Status: DC | PRN

## 2020-05-18 MED ORDER — FENTANYL CITRATE (PF) 100 MCG/2ML IJ SOLN
50.0000 ug | INTRAMUSCULAR | Status: DC | PRN
  Administered 2020-05-19: 100 ug via INTRAVENOUS
  Filled 2020-05-18 (×3): qty 2

## 2020-05-18 MED ORDER — TERBUTALINE SULFATE 1 MG/ML IJ SOLN: 0.2500 mg | Freq: Once | INTRAMUSCULAR | Status: DC | PRN

## 2020-05-18 MED ORDER — ONDANSETRON HCL 4 MG/2ML IJ SOLN: 4.0000 mg | Freq: Four times a day (QID) | INTRAMUSCULAR | Status: DC | PRN

## 2020-05-18 MED ORDER — EPHEDRINE 5 MG/ML INJ: 10.0000 mg | INTRAVENOUS | Status: DC | PRN

## 2020-05-18 MED ORDER — PHENYLEPHRINE 40 MCG/ML (10ML) SYRINGE FOR IV PUSH (FOR BLOOD PRESSURE SUPPORT)
80.0000 ug | PREFILLED_SYRINGE | INTRAVENOUS | Status: DC | PRN
  Filled 2020-05-18: qty 10

## 2020-05-18 MED ORDER — PHENYLEPHRINE 40 MCG/ML (10ML) SYRINGE FOR IV PUSH (FOR BLOOD PRESSURE SUPPORT): 80.0000 ug | PREFILLED_SYRINGE | INTRAVENOUS | Status: DC | PRN

## 2020-05-18 MED ORDER — MISOPROSTOL 25 MCG QUARTER TABLET
ORAL_TABLET | ORAL | Status: AC
  Filled 2020-05-18: qty 1

## 2020-05-18 MED ORDER — MISOPROSTOL 50MCG HALF TABLET
50.0000 ug | ORAL_TABLET | Freq: Once | ORAL | Status: AC
  Administered 2020-05-18: 50 ug via VAGINAL
  Filled 2020-05-18: qty 1

## 2020-05-18 MED ORDER — OXYTOCIN BOLUS FROM INFUSION
333.0000 mL | Freq: Once | INTRAVENOUS | Status: AC
  Administered 2020-05-19: 333 mL via INTRAVENOUS

## 2020-05-18 MED ORDER — LACTATED RINGERS IV SOLN: INTRAVENOUS | Status: DC

## 2020-05-18 NOTE — H&P (Signed)
OBSTETRIC ADMISSION HISTORY AND PHYSICAL  April [name] is a 30 y.o. female G1P0000 at 45w1dby LMP presenting for IOL secondary by gHTN. She reports +FMs, No LOF, no VB, no blurry vision, headaches, and RUQ pain.  She plans on breast feeding initially with bottle feeding PRN. She is undecided for birth control but is considering outpatient IUD as she has had one previously.  [facility]    Dating: By LMP --->  Estimated Date of Delivery: 05/31/20  Sono:  '@[redacted]w[redacted]d' , CWD, normal anatomy, cephalic presentation, anterior lie, amniotic fluid low, 68% EFW  Prenatal History/Complications: gHTN, obesity, low AFI (<3% at 334w5d Past Medical History: Past Medical History:  Diagnosis Date  . Medical history non-contributory     Past Surgical History: Past Surgical History:  Procedure Laterality Date  . NO PAST SURGERIES    . WISDOM TOOTH EXTRACTION  10/2018    Obstetrical History: OB History     Gravida  1   Para  0   Term  0   Preterm  0   AB  0   Living  0      SAB  0   IAB  0   Ectopic  0   Multiple  0   Live Births              Social History Social History   Socioeconomic History  . Marital status: Married    Spouse name: Not on file  . Number of children: 0  . Years of education: Not on file  . Highest education level: Not on file  Occupational History  . Occupation: Student    Comment: [employer] full time  Tobacco Use  . Smoking status: Never Smoker  . Smokeless tobacco: Never Used  Vaping Use  . Vaping Use: Never used  Substance and Sexual Activity  . Alcohol use: Not Currently    Alcohol/week: 0.0 standard drinks    Comment: social  . Drug use: No  . Sexual activity: Not Currently    Partners: Male    Birth control/protection: I.U.D.  Other Topics Concern  . Not on file  Social History Narrative  . Not on file   Social Determinants of Health   Financial Resource Strain: Not on file  Food  Insecurity: No Food Insecurity  . Worried About RuCharity fundraisern the Last Year: Never true  . Ran Out of Food in the Last Year: Never true  Transportation Needs: Not on file  Physical Activity: Not on file  Stress: Not on file  Social Connections: Not on file    Family History: Family History  Problem Relation Age of Onset  . Diabetes Father   . Heart disease Maternal Grandmother   . Lung cancer Paternal Grandfather        smoker    Allergies: No Known Allergies  Medications Prior to Admission  Medication Sig Dispense Refill Last Dose  . albuterol (PROVENTIL HFA;VENTOLIN HFA) 108 (90 Base) MCG/ACT inhaler Inhale 2 puffs into the lungs every 6 (six) hours as needed for wheezing or shortness of breath. (Patient not taking: No sig reported) 1 Inhaler 0   . aspirin 81 MG chewable tablet Chew 1 tablet (81 mg total) by mouth daily. 30 tablet 6   . Blood Pressure Monitoring (BLOOD PRESSURE KIT) DEVI 1 kit by Does not apply route as needed. 1 each 0   . ibuprofen (ADVIL,MOTRIN) 800 MG tablet Take 1 tablet (  800 mg total) by mouth every 8 (eight) hours as needed. (Patient not taking: No sig reported) 30 tablet 5   . Prenatal Vit-Fe Fumarate-FA (MULTIVITAMIN-PRENATAL) 27-0.8 MG TABS tablet Take 1 tablet by mouth daily at 12 noon.     . Vitamin D, Ergocalciferol, (DRISDOL) 1.25 MG (50000 UNIT) CAPS capsule Take 50,000 Units by mouth every 7 (seven) days. (Patient not taking: Reported on 05/09/2020)        Review of Systems   All systems reviewed and negative except as stated in HPI  Blood pressure 121/72, pulse 97, temperature 98.9 F (37.2 C), temperature source Oral, resp. rate 18, height '5\' 3"'  (1.6 m), weight (!) 140.6 kg, last menstrual period 08/25/2019, SpO2 100 %. General appearance: alert, cooperative and no distress Lungs: normal respiratory effort Heart: regular rate Abdomen: soft, non-tender Extremities: Homans sign is negative, +1 pitting edema bilaterally but no sign  of DVT Presentation: cephalic Fetal monitoring: Baseline: 145 bpm, Variability: Good {> 6 bpm), Accelerations: Reactive and Decelerations: Absent Uterine activity: Irregular Dilation: Closed Effacement (%): Thick Station: -3 Exam by:: H.Price, RN  Prenatal labs: ABO, Rh: O/Positive/-- (09/16 1045) Antibody: Negative (09/16 1045) Rubella: 5.54 (09/16 1045) RPR: Non Reactive (01/12 0841)  HBsAg: Negative (09/16 1045)  HIV: Non Reactive (01/12 0841)  GBS: Negative/-- (03/11 0908)  2 hr Glucola: 83/147/96 Genetic screening: Normal Anatomy US: Normal  Prenatal Transfer Tool  Maternal Diabetes: No Genetic Screening: Normal Maternal Ultrasounds/Referrals: Normal Fetal Ultrasounds or other Referrals:  None Maternal Substance Abuse:  No Significant Maternal Medications:  Meds include: Other:  ASA  Significant Maternal Lab Results: Group B Strep negative  Results for orders placed or performed during the hospital encounter of 05/18/20 (from the past 24 hour(s))  CBC   Collection Time: 05/18/20  9:27 AM  Result Value Ref Range   WBC 4.3 4.0 - 10.5 K/uL   RBC 3.88 3.87 - 5.11 MIL/uL   Hemoglobin 11.5 (L) 12.0 - 15.0 g/dL   HCT 34.5 (L) 36.0 - 46.0 %   MCV 88.9 80.0 - 100.0 fL   MCH 29.6 26.0 - 34.0 pg   MCHC 33.3 30.0 - 36.0 g/dL   RDW 15.1 11.5 - 15.5 %   Platelets 296 150 - 400 K/uL   nRBC 0.0 0.0 - 0.2 %    Patient Active Problem List   Diagnosis Date Noted  . Indication for care in labor and delivery, antepartum 05/18/2020  . Gestational hypertension, third trimester 05/16/2020  . Obesity affecting pregnancy in first trimester 11/18/2019  . Supervision of normal first pregnancy, antepartum 09/24/2019  . Vitamin D deficiency 01/18/2018  . Dysmenorrhea 06/26/2012    Assessment/Plan:  April [name] is a 30 y.o. G1P0000 at 45w1dhere for IOL secondary to gHTN.  #Labor: Pt agreeable to initiating cytotec s/p IOL discussion/education. [facility] at next cervical exam. Will transition to pitocin as clinically indicated.  #gHTN: Normal BP on admission. Pt asymptomatic. F/u Preeclampsia labs on admission. #Pain: Epidural PRN #FWB: Cat I strip #ID: GBS neg #MOF: Breast initially, bottle PRN #MOC: Undecided, considering outpatient IUD  NFranco Collet [place] 05/18/2020, 10:18 AM  Attestation of Supervision of Student:  I confirm that I have verified the information documented in the nurse practitioner student's note and that I have also personally reperformed the history, physical exam and all medical decision making activities.  I have verified that all services and findings are accurately documented in this student's note; and I  agree with management and plan as outlined in the documentation. I have also made any necessary editorial changes.  Randa Ngo, [place] for [facility], [facility] 05/18/2020 1:17 PM

## 2020-05-18 NOTE — Progress Notes (Signed)
April [name] is a 30 y.o. G1P0000 at [redacted]w[redacted]d by LMP admitted for induction of labor due to Hypertension and low amniotic fluid (<3% @[redacted]w[redacted]d .  Subjective: Patient resting on side in room, family at bedside for support.  Denies difficulty breathing, respiratory distress, chest pain, excessive vaginal bleeding, headache or visual disturbances, and leg pain or swelling.  Objective: BP (!) 128/59   Pulse 87   Temp 98.6 F (37 C) (Oral)   Resp 18   Ht 5\' 3"  (1.6 m)   Wt (!) 140.6 kg   LMP 08/25/2019   SpO2 100%   BMI 54.91 kg/m  No intake/output data recorded. No intake/output data recorded.  FHT:  FHR: 145 bpm, variability: moderate,  accelerations:  Present,  decelerations:  Absent UC:   irregular, occassional SVE:   Dilation: 1 Effacement (%): Thick Station: -3 Exam by:: V.Rogers, CNM  Labs: Lab Results  Component Value Date   WBC 4.3 05/18/2020   HGB 11.5 (L) 05/18/2020   HCT 34.5 (L) 05/18/2020   MCV 88.9 05/18/2020   PLT 296 05/18/2020    Assessment / Plan: Induction of labor due to gestational HTN  Labor: Foley ballon and cytotec placed  will start pitocin once foley balloon is out. Preeclampsia:  no signs or symptoms of toxicity Fetal Wellbeing:  Category I Pain Control:  Labor support without medications I/D:  n/a Anticipated MOD:  NSVD  05/20/2020, SNM [employer] 05/18/2020, 10:07 PM

## 2020-05-18 NOTE — Progress Notes (Addendum)
April [name] is a 30 y.o. G1P0000 at [redacted]w[redacted]d by LMP admitted for induction of labor due to gestational Hypertension.  Subjective: Patient comfortable in bed.   Objective: BP (!) 113/55 (BP Location: Right Arm)   Pulse 83   Temp 98.6 F (37 C) (Oral)   Resp 18   Ht 5\' 3"  (1.6 m)   Wt (!) 140.6 kg   LMP 08/25/2019   SpO2 100%   BMI 54.91 kg/m  No intake/output data recorded. No intake/output data recorded.  FHT:  FHR: 140 bpm, variability: moderate,  accelerations:  Present,  decelerations:  Absent UC:   none SVE:   Dilation: Fingertip Effacement (%): Thick Station: -3 Exam by:: Dr. [email or web address]  Labs: Lab Results  Component Value Date   WBC 4.3 05/18/2020   HGB 11.5 (L) 05/18/2020   HCT 34.5 (L) 05/18/2020   MCV 88.9 05/18/2020   PLT 296 05/18/2020    Assessment / Plan: Induction of labor due to gestational hypertension,  progressing well on pitocin  Labor: Progressing with minimal change after Cytotec x1.FOley balloon attempted but unsuccessful. Will give another dose cytotec and check in 4 hours with possible balloon placement.  Preeclampsia:  labs stable Fetal Wellbeing:  Category I Pain Control:  Labor support without medications, epidural if requested I/D: Negative  Anticipated MOD:  NSVD  05/20/2020 April [name] 05/18/2020, 4:45 PM

## 2020-05-18 NOTE — Progress Notes (Incomplete)
April [name] is a 30 y.o. G1P0000 at [redacted]w[redacted]d by LMP admitted for induction of labor due to gHTN.  Subjective:  Patient is doing well, reports she is hungry.   Objective:  BP (!) 113/55 (BP Location: Right Arm)   Pulse 83   Temp 98.6 F (37 C) (Oral)   Resp 18   Ht 5\' 3"  (1.6 m)   Wt (!) 140.6 kg   LMP 08/25/2019   SpO2 100%   BMI 54.91 kg/m  No intake/output data recorded. No intake/output data recorded.  FHT:  FHR: *** bpm, variability: {fhr variability:21617},  accelerations:  {Accelerations:21618},  decelerations:  {fhr decel present:21619} UC: *** SVE:   Dilation: Fingertip Effacement (%): Thick Station: -3 Exam by:: Dr. [email or web address]  Labs: Lab Results  Component Value Date   WBC 4.3 05/18/2020   HGB 11.5 (L) 05/18/2020   HCT 34.5 (L) 05/18/2020   MCV 88.9 05/18/2020   PLT 296 05/18/2020    Assessment / Plan: IOL due to gHTN, minimal progression after cytotec x1  Labor: Minimal progression after initial cytotec. Patient agreeable to FB placement, however attempt unsuccessful as cervix was thick and high. Will administer second cytotec dose and recheck in 4 hours, reattempt FB placement.  Gestational HTN: DBP 90 @ 1536. CMP wnl, awaiting UP:C. Patient asymptomatic, continue monitoring.  Fetal Wellbeing:  Category I strip Pain Control:  Epidural PRN I/D:  GBS neg Anticipated MOD:  NSVD  05/20/2020 05/18/2020, 4:44 PM

## 2020-05-19 ENCOUNTER — Encounter (HOSPITAL_COMMUNITY): Payer: BC Managed Care – PPO

## 2020-05-19 ENCOUNTER — Encounter (HOSPITAL_COMMUNITY): Payer: Self-pay | Admitting: Family Medicine

## 2020-05-19 ENCOUNTER — Inpatient Hospital Stay (HOSPITAL_COMMUNITY): Payer: BC Managed Care – PPO | Admitting: Anesthesiology

## 2020-05-19 DIAGNOSIS — O134 Gestational [pregnancy-induced] hypertension without significant proteinuria, complicating childbirth: Secondary | ICD-10-CM

## 2020-05-19 DIAGNOSIS — Z3A38 38 weeks gestation of pregnancy: Secondary | ICD-10-CM

## 2020-05-19 DIAGNOSIS — O139 Gestational [pregnancy-induced] hypertension without significant proteinuria, unspecified trimester: Secondary | ICD-10-CM

## 2020-05-19 LAB — CBC WITH DIFFERENTIAL/PLATELET
Abs Immature Granulocytes: 0.03 10*3/uL (ref 0.00–0.07)
Basophils Absolute: 0 10*3/uL (ref 0.0–0.1)
Basophils Relative: 0 %
Eosinophils Absolute: 0 10*3/uL (ref 0.0–0.5)
Eosinophils Relative: 0 %
HCT: 33.9 % — ABNORMAL LOW (ref 36.0–46.0)
Hemoglobin: 11.4 g/dL — ABNORMAL LOW (ref 12.0–15.0)
Immature Granulocytes: 0 %
Lymphocytes Relative: 19 %
Lymphs Abs: 1.4 10*3/uL (ref 0.7–4.0)
MCH: 30.2 pg (ref 26.0–34.0)
MCHC: 33.6 g/dL (ref 30.0–36.0)
MCV: 89.9 fL (ref 80.0–100.0)
Monocytes Absolute: 0.5 10*3/uL (ref 0.1–1.0)
Monocytes Relative: 7 %
Neutro Abs: 5.2 10*3/uL (ref 1.7–7.7)
Neutrophils Relative %: 74 %
Platelets: 272 10*3/uL (ref 150–400)
RBC: 3.77 MIL/uL — ABNORMAL LOW (ref 3.87–5.11)
RDW: 15.1 % (ref 11.5–15.5)
WBC: 7.1 10*3/uL (ref 4.0–10.5)
nRBC: 0 % (ref 0.0–0.2)

## 2020-05-19 MED ORDER — ZOLPIDEM TARTRATE 5 MG PO TABS: 5.0000 mg | ORAL_TABLET | Freq: Every evening | ORAL | Status: DC | PRN

## 2020-05-19 MED ORDER — DIPHENHYDRAMINE HCL 25 MG PO CAPS: 25.0000 mg | ORAL_CAPSULE | Freq: Four times a day (QID) | ORAL | Status: DC | PRN

## 2020-05-19 MED ORDER — TETANUS-DIPHTH-ACELL PERTUSSIS 5-2.5-18.5 LF-MCG/0.5 IM SUSY: 0.5000 mL | PREFILLED_SYRINGE | Freq: Once | INTRAMUSCULAR | Status: DC

## 2020-05-19 MED ORDER — DIBUCAINE (PERIANAL) 1 % EX OINT: 1.0000 "application " | TOPICAL_OINTMENT | CUTANEOUS | Status: DC | PRN

## 2020-05-19 MED ORDER — SENNOSIDES-DOCUSATE SODIUM 8.6-50 MG PO TABS
2.0000 | ORAL_TABLET | Freq: Every day | ORAL | Status: DC
  Administered 2020-05-20 – 2020-05-21 (×2): 2 via ORAL
  Filled 2020-05-19 (×2): qty 2

## 2020-05-19 MED ORDER — COCONUT OIL OIL: 1.0000 "application " | TOPICAL_OIL | Status: DC | PRN

## 2020-05-19 MED ORDER — LIDOCAINE HCL (PF) 1 % IJ SOLN
INTRAMUSCULAR | Status: DC | PRN
  Administered 2020-05-19 (×2): 5 mL via EPIDURAL

## 2020-05-19 MED ORDER — WITCH HAZEL-GLYCERIN EX PADS: 1.0000 "application " | MEDICATED_PAD | CUTANEOUS | Status: DC | PRN

## 2020-05-19 MED ORDER — ONDANSETRON HCL 4 MG PO TABS: 4.0000 mg | ORAL_TABLET | ORAL | Status: DC | PRN

## 2020-05-19 MED ORDER — OXYTOCIN-SODIUM CHLORIDE 30-0.9 UT/500ML-% IV SOLN
1.0000 m[IU]/min | INTRAVENOUS | Status: DC
  Administered 2020-05-19: 14 m[IU]/min via INTRAVENOUS
  Administered 2020-05-19: 12 m[IU]/min via INTRAVENOUS
  Administered 2020-05-19: 2 m[IU]/min via INTRAVENOUS

## 2020-05-19 MED ORDER — BENZOCAINE-MENTHOL 20-0.5 % EX AERO
1.0000 "application " | INHALATION_SPRAY | CUTANEOUS | Status: DC | PRN
  Filled 2020-05-19 (×2): qty 56

## 2020-05-19 MED ORDER — SIMETHICONE 80 MG PO CHEW: 80.0000 mg | CHEWABLE_TABLET | ORAL | Status: DC | PRN

## 2020-05-19 MED ORDER — ONDANSETRON HCL 4 MG/2ML IJ SOLN: 4.0000 mg | INTRAMUSCULAR | Status: DC | PRN

## 2020-05-19 MED ORDER — ACETAMINOPHEN 325 MG PO TABS: 650.0000 mg | ORAL_TABLET | ORAL | Status: DC | PRN

## 2020-05-19 MED ORDER — PRENATAL MULTIVITAMIN CH
1.0000 | ORAL_TABLET | Freq: Every day | ORAL | Status: DC
  Administered 2020-05-20 – 2020-05-21 (×2): 1 via ORAL
  Filled 2020-05-19 (×2): qty 1

## 2020-05-19 MED ORDER — IBUPROFEN 600 MG PO TABS
600.0000 mg | ORAL_TABLET | Freq: Four times a day (QID) | ORAL | Status: DC
  Administered 2020-05-19 – 2020-05-21 (×6): 600 mg via ORAL
  Filled 2020-05-19 (×8): qty 1

## 2020-05-19 NOTE — Anesthesia Procedure Notes (Signed)
Epidural Patient location during procedure: OB Start time: 05/19/2020 12:27 PM End time: 05/19/2020 12:41 PM  Staffing Anesthesiologist: Heather Roberts, MD Performed: anesthesiologist   Preanesthetic Checklist Completed: patient identified, IV checked, site marked, risks and benefits discussed, monitors and equipment checked, pre-op evaluation and timeout performed  Epidural Patient position: sitting Prep: DuraPrep Patient monitoring: heart rate, cardiac monitor, continuous pulse ox and blood pressure Approach: midline Location: L2-L3 Injection technique: LOR saline  Needle:  Needle type: Tuohy  Needle gauge: 17 G Needle length: 9 cm Needle insertion depth: 8 cm Catheter size: 20 Guage Catheter at skin depth: 13 cm Test dose: negative and Other  Assessment Events: blood not aspirated, injection not painful, no injection resistance and negative IV test  Additional Notes Informed consent obtained prior to proceeding including risk of failure, 1% risk of PDPH, risk of minor discomfort and bruising.  Discussed rare but serious complications including epidural abscess, permanent nerve injury, epidural hematoma.  Discussed alternatives to epidural analgesia and patient desires to proceed.  Timeout performed pre-procedure verifying patient name, procedure, and platelet count.  Patient tolerated procedure well.

## 2020-05-19 NOTE — Progress Notes (Addendum)
April [name] is a 30 y.o. G1P0000 at [redacted]w[redacted]d by LMP admitted for induction of labor due to Hypertension.  Subjective: Patient having painful contractions   Objective: BP 124/65   Pulse 75   Temp 98.5 F (36.9 C) (Oral)   Resp 17   Ht 5\' 3"  (1.6 m)   Wt (!) 140.6 kg   LMP 08/25/2019   SpO2 100%   BMI 54.91 kg/m  I/O last 3 completed shifts: In: 2157.1 [I.V.:2157.1] Out: -  No intake/output data recorded.  FHT:  FHR: 133 bpm, variability: moderate,  accelerations:  Present,  decelerations:  Absent UC:   regular, every 2-3 minutes SVE:   Dilation: 5.5 Effacement (%): 50 Station: -3 Exam by:: April Rothermel, RN  Labs: Lab Results  Component Value Date   WBC 7.1 05/19/2020   HGB 11.4 (L) 05/19/2020   HCT 33.9 (L) 05/19/2020   MCV 89.9 05/19/2020   PLT 272 05/19/2020    Assessment / Plan: Induction of labor due to gestational hypertension,  progressing well on pitocin  Labor: Progressing on Pitocin, will continue to increase then AROM Preeclampsia:  labs stable Fetal Wellbeing:  Category I Pain Control:  Labor support without medications I/D:  GBS neg Anticipated MOD:  NSVD  GHTN: BP well controlled   05/21/2020 April [name] 05/19/2020, 9:14 AM

## 2020-05-19 NOTE — Progress Notes (Addendum)
April [name] is a 30 y.o. G1P0000 at [redacted]w[redacted]d by LMP admitted for induction of labor due to Hypertension.  Subjective: Comfortable in bed, contractions noted  Objective: BP 116/64   Pulse 86   Temp 98.7 F (37.1 C) (Oral)   Resp 16   Ht 5\' 3"  (1.6 m)   Wt (!) 140.6 kg   LMP 08/25/2019   SpO2 97%   BMI 54.91 kg/m  I/O last 3 completed shifts: In: 2157.1 [I.V.:2157.1] Out: -  No intake/output data recorded.  FHT:  FHR: 140 bpm, variability: moderate,  accelerations:  Present,  decelerations:  Absent UC:   regular, every 2-3 minutes SVE:   Dilation: 7 Effacement (%): 80 Station: -2 Exam by:: [email or web address], RN  Labs: Lab Results  Component Value Date   WBC 7.1 05/19/2020   HGB 11.4 (L) 05/19/2020   HCT 33.9 (L) 05/19/2020   MCV 89.9 05/19/2020   PLT 272 05/19/2020    Assessment / Plan: Induction of labor due to gestational hypertension,  progressing well on pitocin, AROM at 1515  Labor: Progressing on Pitocin, AROM at 1515 GHTN: PEC labs stable. Normotensive to mild range.  Fetal Wellbeing:  Category I Pain Control:  Labor support without medications I/D:  GBS neg Anticipated MOD:  NSVD  05/21/2020 Muus 05/19/2020, 3:16 PM   I saw and evaluated the patient. I agree with the findings and the plan of care as documented in the resident's note.  05/21/2020, MD [facility] Family Medicine Fellow, [facility] for [facility], [facility]

## 2020-05-19 NOTE — Progress Notes (Addendum)
April [name] is a 30 y.o. G1P0000 at [redacted]w[redacted]d by LMP admitted for induction of labor due to Hypertension.  Subjective: Patient sitting up in bed, rates pain a four (4) out of ten (10). Denies pain meds at this time.  Denies difficulty breathing, respiratory distress, chest pain, vaginal bleeding, and leg pain.  Objective: BP 127/68   Pulse 75   Temp 99.2 F (37.3 C) (Oral)   Resp 18   Ht 5\' 3"  (1.6 m)   Wt (!) 140.6 kg   LMP 08/25/2019   SpO2 100%   BMI 54.91 kg/m  No intake/output data recorded. Total I/O In: 2157.1 [I.V.:2157.1] Out: -   FHT:  FHR: 140 bpm, variability: minimal ,  accelerations:  Abscent,  decelerations:  Absent UC:   regular, every 2-4 minutes Declined cervical check at this time, desires to wait a little longer. SVE:   Dilation: 4.5 Effacement (%): 50 Station: -2 Exam by:: [email or web address], rnc  Pitocin: 8 milU/min  Labs: Lab Results  Component Value Date   WBC 4.3 05/18/2020   HGB 11.5 (L) 05/18/2020   HCT 34.5 (L) 05/18/2020   MCV 88.9 05/18/2020   PLT 296 05/18/2020    Assessment / Plan: Induction of labor due to gestational hypertension,  progressing well on pitocin  Labor: Progressing on Pitocin, will continue to increase then AROM   Preeclampsia:  no signs or symptoms of toxicity Fetal Wellbeing:  Category II Pain Control:  Labor support without medications I/D:  n/a Anticipated MOD:  NSVD  05/20/2020 Louisa Second 05/19/2020, 5:26 AM

## 2020-05-19 NOTE — Discharge Summary (Addendum)
Postpartum Discharge Summary  Date of Service updated 05/21/20    Patient Name: April [name] DOB: September 15, 1990 MRN: [id number]  Date of admission: 05/18/2020 Delivery date:05/19/2020  Delivering provider: Verneda Skill  Date of discharge: 05/21/2020  Admitting diagnosis: Indication for care in labor and delivery, antepartum [O75.9] Intrauterine pregnancy: [redacted]w[redacted]d    Secondary diagnosis:  Active Problems:   Indication for care in labor and delivery, antepartum   Vaginal delivery   Gestational hypertension  Additional problems: gHTN    Discharge diagnosis: Term Pregnancy Delivered                                              Post partum procedures: Augmentation: AROM, Pitocin, Cytotec and IP Foley Complications: None  Hospital course: Induction of Labor With Vaginal Delivery   30y.o. yo G1P0000 at 321w2das admitted to the hospital 05/18/2020 for induction of labor.  Indication for induction: Gestational hypertension.  Patient had an uncomplicated labor course as follows: Membrane Rupture Time/Date: 3:12 PM ,05/19/2020   Delivery Method:Vaginal, Spontaneous  Episiotomy: None  Lacerations:  2nd degree;Perineal  Details of delivery can be found in separate delivery note.  Patient had a routine postpartum course. Patient is discharged home 05/21/20.  Newborn Data: Birth date:05/19/2020  Birth time:6:19 PM  Gender:Female  Living status:Living  Apgars:8 ,9  Weight:3155 g   Magnesium Sulfate received: No BMZ received: No Rhophylac:N/A MMR:N/A T-DaP:Given prenatally Flu: Yes Transfusion:No  Physical exam  Vitals:   05/20/20 1622 05/20/20 2100 05/21/20 0529 05/21/20 0600  BP: 103/70 120/66 134/84 117/72  Pulse: 88 81 87 72  Resp: '18 18 18   ' Temp: 98.1 F (36.7 C) 98.4 F (36.9 C) 98.1 F (36.7 C)   TempSrc: Oral Oral Oral   SpO2: 100%  100%   Weight:      Height:       General: alert Lochia: appropriate Uterine Fundus: firm Incision: N/A DVT Evaluation:  No evidence of DVT seen on physical exam. Labs: Lab Results  Component Value Date   WBC 11.7 (H) 05/20/2020   HGB 9.2 (L) 05/20/2020   HCT 27.1 (L) 05/20/2020   MCV 89.7 05/20/2020   PLT 255 05/20/2020   CMP Latest Ref Rng & Units 05/18/2020  Glucose 70 - 99 mg/dL 100(H)  BUN 6 - 20 mg/dL <5(L)  Creatinine 0.44 - 1.00 mg/dL 0.65  Sodium 135 - 145 mmol/L 135  Potassium 3.5 - 5.1 mmol/L 3.7  Chloride 98 - 111 mmol/L 107  CO2 22 - 32 mmol/L 21(L)  Calcium 8.9 - 10.3 mg/dL 9.2  Total Protein 6.5 - 8.1 g/dL 6.5  Total Bilirubin 0.3 - 1.2 mg/dL 0.5  Alkaline Phos 38 - 126 U/L 138(H)  AST 15 - 41 U/L 24  ALT 0 - 44 U/L 24   Edinburgh Score: Edinburgh Postnatal Depression Scale Screening Tool 05/20/2020  I have been able to laugh and see the funny side of things. 0  I have looked forward with enjoyment to things. 0  I have blamed myself unnecessarily when things went wrong. 0  I have been anxious or worried for no good reason. 0  I have felt scared or panicky for no good reason. 0  Things have been getting on top of me. 0  I have been so unhappy that I have had difficulty sleeping. 0  I have felt sad or miserable. 0  I have been so unhappy that I have been crying. 0  The thought of harming myself has occurred to me. 0  Edinburgh Postnatal Depression Scale Total 0     After visit meds:  Allergies as of 05/21/2020   No Known Allergies     Medication List    STOP taking these medications   acetaminophen 500 MG tablet Commonly known as: TYLENOL   aspirin 81 MG chewable tablet   Blood Pressure Kit Devi   Vitamin D (Ergocalciferol) 1.25 MG (50000 UNIT) Caps capsule Commonly known as: DRISDOL     TAKE these medications   ibuprofen 600 MG tablet Commonly known as: ADVIL Take 1 tablet (600 mg total) by mouth every 6 (six) hours.   prenatal multivitamin Tabs tablet Take 1 tablet by mouth daily at 12 noon.        Discharge home in stable condition Infant Feeding:  Bottle and Breast Infant Disposition:home with mother Discharge instruction: per After Visit Summary and Postpartum booklet. Activity: Advance as tolerated. Pelvic rest for 6 weeks.  Diet: routine diet Future Appointments: Future Appointments  Date Time Provider [place]  06/20/2020 11:10 AM Wende Mott, CNM [facility] None   Follow up Visit:   Please schedule this patient for a In person postpartum visit in 6 weeks with the following provider: Any provider. Additional Postpartum F/U:BP check 1 week  Low risk pregnancy complicated by: HTN Delivery mode:  Vaginal, Spontaneous  Anticipated Birth Control:  Unsure, considering OP IUD   05/21/2020 Brooks Sailors, MD   I spoke with and examined patient and agree with resident/PA-S/MS/SNM's note and plan of care. BP good this am, Denies ha, visual changes, ruq/epigastric pain, n/v.  Reviewed pre-e s/s, reasons to seek care. Check bp daily, if consistently >130/80 call [facility] to let them know. F/U 1wk for bp check. Abstinence until IUD insertion. Roma Schanz, CNM, [facility] 05/21/2020 9:04 AM

## 2020-05-19 NOTE — Progress Notes (Signed)
April [name] is a 30 y.o. G1P0000 at [redacted]w[redacted]d by LMP admitted for induction of labor due to Hypertension.  Subjective: Patient reports doing well.   Objective: BP 130/75   Pulse 85   Temp 98.5 F (36.9 C) (Oral)   Resp 16   Ht 5\' 3"  (1.6 m)   Wt (!) 140.6 kg   LMP 08/25/2019   SpO2 97%   BMI 54.91 kg/m  I/O last 3 completed shifts: In: 2157.1 [I.V.:2157.1] Out: -  No intake/output data recorded.   FHT:  FHR: 145 bpm, variability: moderate,  accelerations:  Present,  decelerations:  Absent UC:   Difficulty picking up contractions, will place IUPC SVE:   Dilation: 7.5 Effacement (%): 80,90 Station: -2 Exam by:: [email or web address], RN  Labs: Lab Results  Component Value Date   WBC 7.1 05/19/2020   HGB 11.4 (L) 05/19/2020   HCT 33.9 (L) 05/19/2020   MCV 89.9 05/19/2020   PLT 272 05/19/2020   Assessment / Plan: Augmentation of labor, progressing well.   Labor: Minimal change since last check. Given difficulty with picking up contractions, placed IUPC at 1845.  Preeclampsia:  labs stable Fetal Wellbeing:  Category I Pain Control:  Epidural I/D:  GBS negative Anticipated MOD:  NSVD  05/21/2020 April [name] 05/19/2020, 5:58 PM

## 2020-05-19 NOTE — Progress Notes (Signed)
April [name] is a 30 y.o. G1P0000 at [redacted]w[redacted]d by LMP admitted for induction of labor due to Hypertension.  Subjective: Patient sitting on side of bed, states foley bulb is out expressed some discomfort, cramping. Doesn't desire anything for pain at this time.  Objective: BP 116/73   Pulse 88   Temp 99 F (37.2 C) (Oral)   Resp 17   Ht 5\' 3"  (1.6 m)   Wt (!) 140.6 kg   LMP 08/25/2019   SpO2 100%   BMI 54.91 kg/m  No intake/output data recorded. No intake/output data recorded.  FHT:  FHR: 140 bpm, variability: moderate,  accelerations:  Present,  decelerations:  Absent UC:   irregular, every 2-5 minutes SVE:   Dilation: 4.5 Effacement (%): 50 Station: -2 Exam by:: [email or web address], rnc  Labs: Lab Results  Component Value Date   WBC 4.3 05/18/2020   HGB 11.5 (L) 05/18/2020   HCT 34.5 (L) 05/18/2020   MCV 88.9 05/18/2020   PLT 296 05/18/2020    Assessment / Plan: Augmentation of labor, progressing well  Labor: Foley bulb is out, pitocin started, will increase then AROM Preeclampsia:  no signs or symptoms of toxicity Fetal Wellbeing:  Category I Pain Control:  Labor support without medications, patient desires to see how long she can go without medications. Discussed her pain management options and to let the nurse know if she wants anything for pain. I/D:  n/a Anticipated MOD:  NSVD   05/20/2020, Juliann Pares 05/19/2020, 1:46 AM

## 2020-05-19 NOTE — Lactation Note (Signed)
This note was copied from a baby's chart. Lactation Consultation Note  Patient Name: April [name] YSAYT'K Date: 05/19/2020 Reason for consult: L&D Initial assessment Age:30 hours  L&D consult with 1 hour old infant and P1 mother. Parents and grandmother are present at time of consult. Congratulated them on their newborn. Assisted with latch laid back position right breast. Discussed STS as ideal transition for infants after birth helping with temperature, blood sugar and comfort. Talked about primal reflexes such as rooting, hands to mouth, searching for the breast among others.   Several attempts to latch, infant holds nipple in mouth. Placed infant skin to skin prone position. Explained LC services availability during postpartum stay. Thanked family for their time.    Feeding Mother's Current Feeding Choice: Breast Milk  LATCH Score Latch: Repeated attempts needed to sustain latch, nipple held in mouth throughout feeding, stimulation needed to elicit sucking reflex.  Audible Swallowing: None  Type of Nipple: Flat  Comfort (Breast/Nipple): Soft / non-tender  Hold (Positioning): Assistance needed to correctly position infant at breast and maintain latch.  LATCH Score: 5   Interventions Interventions: Assisted with latch;Skin to skin;Breast massage;Education  Consult Status Date: 05/19/20 Follow-up type: In-patient    Larsen Dungan A Higuera Ancidey 05/19/2020, 7:25 PM

## 2020-05-19 NOTE — Anesthesia Preprocedure Evaluation (Signed)
Anesthesia Evaluation  Patient identified by MRN, date of birth, ID band Patient awake    Reviewed: Allergy & Precautions, NPO status , Patient's Chart, lab work & pertinent test results  Airway Mallampati: III       Dental   Pulmonary neg pulmonary ROS,    Pulmonary exam normal        Cardiovascular hypertension, Normal cardiovascular exam     Neuro/Psych negative neurological ROS  negative psych ROS   GI/Hepatic negative GI ROS, Neg liver ROS,   Endo/Other  Morbid obesity  Renal/GU negative Renal ROS  negative genitourinary   Musculoskeletal negative musculoskeletal ROS (+)   Abdominal   Peds negative pediatric ROS (+)  Hematology negative hematology ROS (+)   Anesthesia Other Findings   Reproductive/Obstetrics negative OB ROS                            Anesthesia Physical Anesthesia Plan  ASA: III  Anesthesia Plan: Epidural   Post-op Pain Management:    Induction:   PONV Risk Score and Plan:   Airway Management Planned: Natural Airway  Additional Equipment:   Intra-op Plan:   Post-operative Plan:   Informed Consent: I have reviewed the patients History and Physical, chart, labs and discussed the procedure including the risks, benefits and alternatives for the proposed anesthesia with the patient or authorized representative who has indicated his/her understanding and acceptance.       Plan Discussed with: Anesthesiologist  Anesthesia Plan Comments:         Anesthesia Quick Evaluation

## 2020-05-20 LAB — CBC
HCT: 27.1 % — ABNORMAL LOW (ref 36.0–46.0)
Hemoglobin: 9.2 g/dL — ABNORMAL LOW (ref 12.0–15.0)
MCH: 30.5 pg (ref 26.0–34.0)
MCHC: 33.9 g/dL (ref 30.0–36.0)
MCV: 89.7 fL (ref 80.0–100.0)
Platelets: 255 10*3/uL (ref 150–400)
RBC: 3.02 MIL/uL — ABNORMAL LOW (ref 3.87–5.11)
RDW: 15.1 % (ref 11.5–15.5)
WBC: 11.7 10*3/uL — ABNORMAL HIGH (ref 4.0–10.5)
nRBC: 0 % (ref 0.0–0.2)

## 2020-05-20 NOTE — Lactation Note (Signed)
This note was copied from a baby's chart. Lactation Consultation Note  Patient Name: April [name] JJOAC'Z Date: 05/20/2020 Reason for consult: Follow-up assessment;Early term 37-38.6wks;Primapara;1st time breastfeeding Age:30 hours  P1 mother whose infant is now 32 hours old.  This is an ETI at 38+2 weeks.  Arrived to find mother trying to latch baby to the breast.  Baby had a shallow latch and smacking noises noted.  Offered to assist with latching and mother agreeable. Breast feeding basics reviewed and suggestions made for better positioning and latching.  Baby able to latch easily in the football hold to the right breast.  Gentle stimulation needed to initiate and continue sucking.  Mother able to feel a good tug and denied pain.  Demonstrated breast compressions and baby fed for 12 minutes.  Burped and swaddled baby at the end of the feeding and placed her in the bassinet.  Mother appreciative.  Mother will call her RN/LC for further assistance today.  Father present.   Maternal Data    Feeding Mother's Current Feeding Choice: Breast Milk  LATCH Score Latch: Repeated attempts needed to sustain latch, nipple held in mouth throughout feeding, stimulation needed to elicit sucking reflex.  Audible Swallowing: None  Type of Nipple: Everted at rest and after stimulation  Comfort (Breast/Nipple): Soft / non-tender  Hold (Positioning): Assistance needed to correctly position infant at breast and maintain latch.  LATCH Score: 6   Lactation Tools Discussed/Used    Interventions Interventions: Breast feeding basics reviewed;Assisted with latch;Skin to skin;Breast compression;Adjust position;Position options;Support pillows;Education  Discharge    Consult Status Consult Status: Follow-up Date: 05/21/20 Follow-up type: In-patient    Saadiq Poche R Keimora Swartout 05/20/2020, 5:14 AM

## 2020-05-20 NOTE — Lactation Note (Signed)
This note was copied from a baby's chart. Lactation Consultation Note  Patient Name: April [name] [id number]'H Date: 05/20/2020 Reason for consult: Follow-up assessment;Mother's request;Difficult latch;Primapara;1st time breastfeeding;Early term 37-38.6wks Age:30 hours  LC reviewed with Mom some long periods without a feeding overnight. Mom/Dad to set their alarm to help them wake in case they miss a cue.  Mom's nipples are soft and compressible will erect with stimulation but short shafted and hard for infant to sustain. LC taught Mom how to do a tea cup hold with Dad's help and to recline bringing infant in a prone position in football to help her sustain the latch.   Mom given breast shells to wear when she is not pumping, sleeping or nursing. Mom also using manual pump before latching.  Plan 1. Mom to offer both breasts during a feed to look for swallows with breast compression, STS.         2. Mom to pre pump and use breast shells to help bring her nipples out.          3. Mom to use manual pump q 3hrs for 15 minutes offer any EBM via spoon if infant still showing hunger cues after latching.  All questions answered at the end of the visit.   Maternal Data Has patient been taught Hand Expression?: Yes  Feeding Mother's Current Feeding Choice: Breast Milk  LATCH Score Latch: Repeated attempts needed to sustain latch, nipple held in mouth throughout feeding, stimulation needed to elicit sucking reflex.  Audible Swallowing: Spontaneous and intermittent  Type of Nipple: Flat (short shafted and compressible will erect with stimulation)  Comfort (Breast/Nipple): Soft / non-tender  Hold (Positioning): Assistance needed to correctly position infant at breast and maintain latch.  LATCH Score: 7   Lactation Tools Discussed/Used Tools: Flanges;Coconut oil;Shells Flange Size: 27 Breast pump type: Manual Reason for Pumping: increase stimulation Pumping frequency: Mom to pre pump  5-10 minutes before latching  Interventions Interventions: Breast feeding basics reviewed;Assisted with latch;Skin to skin;Breast massage;Hand express;Breast compression;Adjust position;Support pillows;Position options;Expressed milk;Coconut oil;Shells;Hand pump;Education  Discharge    Consult Status Consult Status: Follow-up Date: 05/21/20 Follow-up type: In-patient    Pietra Zuluaga  Nicholson-Springer 05/20/2020, 6:32 PM

## 2020-05-20 NOTE — Progress Notes (Addendum)
Post Partum Day 1 Subjective:  Patient resting comfortable in bed. Baby in basinet.  no complaints, up ad lib, voiding, tolerating PO and + flatus   Denies difficulty breathing, respiratory distress, chest pain, excessive vaginal bleeding.  Objective:  Blood pressure 122/79, pulse 81, temperature 98.6 F (37 C), temperature source Oral, resp. rate 16, height '5\' 3"'  (1.6 m), weight (!) 140.6 kg, last menstrual period 08/25/2019, SpO2 100 %, unknown if currently breastfeeding.  Physical Exam:   General: alert, cooperative, appears stated age and no distress Lochia: appropriate Uterine Fundus: firm  DVT Evaluation: No evidence of DVT seen on physical exam. Negative Homan's sign. No cords or calf tenderness. Calf/Ankle edema is present, mild  Recent Labs    05/19/20 0816 05/20/20 0538  HGB 11.4* 9.2*  HCT 33.9* 27.1*    Assessment/Plan: Plan for discharge tomorrow, Breastfeeding and Contraception undecided  Reviewed birth control options, patient verbalized understanding, denied any questions at this time.    Discussed increasing fluid intake.  Daneil Dan, [place] 05/20/2020, 7:19 AM    I personally saw and evaluated the patient, performing the key elements of the service. I developed and verified the management plan that is described in the resident's/student's note, and I agree with the content with my edits above. VSS, HRR&R, Resp unlabored, Legs neg.  Nigel Berthold, CNM 05/20/2020 7:43 AM

## 2020-05-20 NOTE — Anesthesia Postprocedure Evaluation (Signed)
Anesthesia Post Note  Patient: Karee Pegues Beevers  Procedure(s) Performed: AN AD HOC LABOR EPIDURAL     Patient location during evaluation: Mother Baby Anesthesia Type: Epidural Level of consciousness: awake, awake and alert and oriented Pain management: pain level controlled Vital Signs Assessment: post-procedure vital signs reviewed and stable Respiratory status: spontaneous breathing and respiratory function stable Cardiovascular status: blood pressure returned to baseline Postop Assessment: no headache, epidural receding, patient able to bend at knees, adequate PO intake, no backache, no apparent nausea or vomiting and able to ambulate Anesthetic complications: no   No complications documented.  Last Vitals:  Vitals:   05/20/20 0215 05/20/20 0535  BP: (!) 111/54 122/79  Pulse: 90 81  Resp: 18 16  Temp: 36.9 C 37 C  SpO2: 100% 100%    Last Pain:  Vitals:   05/20/20 0535  TempSrc: Oral  PainSc: 0-No pain   Pain Goal: Patients Stated Pain Goal: 3 (05/20/20 0215)                 Cleda Clarks

## 2020-05-20 NOTE — Lactation Note (Signed)
This note was copied from a baby's chart. Lactation Consultation Note  Patient Name: April [name] [id number] Date: 05/20/2020 Reason for consult: Initial assessment;Early term 37-38.6wks;1st time breastfeeding Age:30 hours LC entered the room mom was attempting to latch infant at the breast. LC gave mom support pillows that was placed under infant, LC ask mom to do breast stimulation and express a small amount of colostrum prior to latching infant at the breast. Mom has flat nipples that responds well to stimulation. Mom latched infant on her right breast using the football hold position, infant latched and sustain latch, breastfeeding for 8 minutes. Afterwards mom was taught hand expression and infant was given 7 mls of colostrum by spoon.  LC advised mom to pre-pump breast prior to latching infant at the breast to help evert nipple shaft out more. RN on [facility]) will give mom hand pump and re-explain how to use prior to latching infant at the breast.  Mom will continue to breastfeed infant according to cues, 8 to 12 or more times within 24 hours, STS. Mom knows to call RN or LC if she needs further assistance with latching infant at the breast. LC discussed infant's input and output with parents. Mom made aware of O/P services, breastfeeding support groups, community resources, and our phone # for post-discharge questions.  Maternal Data Has patient been taught Hand Expression?: Yes Does the patient have breastfeeding experience prior to this delivery?: No  Feeding Mother's Current Feeding Choice: Breast Milk and Formula  LATCH Score Latch: Grasps breast easily, tongue down, lips flanged, rhythmical sucking.  Audible Swallowing: A few with stimulation  Type of Nipple: Flat  Comfort (Breast/Nipple): Soft / non-tender  Hold (Positioning): Assistance needed to correctly position infant at breast and maintain latch.  LATCH Score: 7   Lactation Tools Discussed/Used Tools:  Pump Breast pump type: Manual Pump Education: Setup, frequency, and cleaning;Milk Storage Reason for Pumping: Mom will pre-pump breast prior to latching infant at the breast due to having flat nipples that responds well to stimulation  Interventions Interventions: Breast feeding basics reviewed;Assisted with latch;Skin to skin;Support pillows;Adjust position;Breast compression;Hand pump;Breast massage;Position options;Hand express;Expressed milk;Pre-pump if needed  Discharge Pump: Manual;Personal (Mom in process of ordering her Personal DEBP with her insurace carrier.) WIC Program: No  Consult Status Consult Status: Follow-up Date: 05/20/20 Follow-up type: In-patient    Danelle Earthly 05/20/2020, 12:32 AM

## 2020-05-21 MED ORDER — IBUPROFEN 600 MG PO TABS: 600.0000 mg | ORAL_TABLET | Freq: Four times a day (QID) | ORAL | 0 refills | Status: AC

## 2020-05-21 NOTE — Lactation Note (Signed)
This note was copied from a baby's chart. Lactation Consultation Note  Patient Name: April [name] [id number] Date: 05/21/2020 Reason for consult: Follow-up assessment;Primapara;1st time breastfeeding;Early term 37-38.6wks Age:30 hours  Follow up visit to 43 hours old with 7.61% weight loss of a P1. Mother needs to be fit for a nipple shield. LC brought NS 20 mm, demonstrated placement and mother able to replicate. Baby is ready to eat upon arrival. LC undressed infant and assisted with latch, football position, right breast. Infant latched with ease and noted  Colostrum pooling inside NS quickly. Infant has been at breast for ~36minutes upon LC leaving room.   Infant has been been having good voids and stools, per mother.  LC completed and sent paperwork for [facility] Pump. Pump will be shipped and family should have it in 3-5 business days.   Feeding plan:  1. Breastfeed following hunger cues or 8-12 times in 24h period.  2. Use nipple shield 20 mm as needed 3. Stimulate infant awake at the breast 4. If needed, supplement with EBM following guidelines, pace bottle feeding and fullness cues.   5. Encouraged maternal rest, hydration and food intake.  6. Contact Lactation Services or local resources for support, questions or concerns.    All questions answered at this time. Family is ready to go home   Maternal Data Has patient been taught Hand Expression?: Yes Does the patient have breastfeeding experience prior to this delivery?: No  Feeding Mother's Current Feeding Choice: Breast Milk  LATCH Score Latch: Grasps breast easily, tongue down, lips flanged, rhythmical sucking.  Audible Swallowing: Spontaneous and intermittent  Type of Nipple: Flat (NS 20)  Comfort (Breast/Nipple): Soft / non-tender  Hold (Positioning): Assistance needed to correctly position infant at breast and maintain latch.  LATCH Score: 8   Lactation Tools Discussed/Used Tools: Pump;Nipple  Shields Nipple shield size: 20 Flange Size: 24 Breast pump type: Manual Pump Education: Setup, frequency, and cleaning;Milk Storage Reason for Pumping: nipple eversion and supplementation Pumping frequency: Q3  Interventions Interventions: Breast feeding basics reviewed;Assisted with latch;Skin to skin;Breast massage;Hand express;Pre-pump if needed;Adjust position;Hand pump;Expressed milk;Position options;Support pillows;Education  Discharge Discharge Education: Engorgement and breast care;Warning signs for feeding baby Pump: Personal;Manual  Consult Status Consult Status: Complete Date: 05/21/20 Follow-up type: Call as needed    Takeria Marquina A Higuera Ancidey 05/21/2020, 2:06 PM

## 2020-05-22 ENCOUNTER — Ambulatory Visit: Payer: BC Managed Care – PPO

## 2020-05-22 ENCOUNTER — Encounter: Payer: BC Managed Care – PPO | Admitting: Certified Nurse Midwife

## 2020-05-22 ENCOUNTER — Ambulatory Visit: Payer: Self-pay

## 2020-05-22 NOTE — Lactation Note (Signed)
This note was copied from a baby's chart. Lactation Consultation Note  Patient Name: April [name] Today's Date: 05/22/2020   Age:29 hours   P1, Baby cueing.  Assisted with latching.  Mother has flat nipples. Baby latches with teacup hold assistance but comes off and on breast.  Mother has good flow of colostrum. Briefly discussed mother's pump sitution and mother may be interested in [place] pump.  Mother had been given a #20NS.  Linels IBCLC will give instruciton on how to use #20NS.  [name] [facility] 05/22/2020, 7:16 AM

## 2020-05-29 ENCOUNTER — Encounter: Payer: BC Managed Care – PPO | Admitting: Obstetrics and Gynecology

## 2020-05-29 ENCOUNTER — Ambulatory Visit: Payer: BC Managed Care – PPO

## 2020-06-05 ENCOUNTER — Other Ambulatory Visit: Payer: Self-pay | Admitting: Obstetrics and Gynecology

## 2020-06-05 ENCOUNTER — Encounter: Payer: BC Managed Care – PPO | Admitting: Obstetrics and Gynecology

## 2020-06-05 DIAGNOSIS — O99211 Obesity complicating pregnancy, first trimester: Secondary | ICD-10-CM

## 2020-06-20 ENCOUNTER — Other Ambulatory Visit: Payer: Self-pay

## 2020-06-20 ENCOUNTER — Encounter: Payer: Self-pay | Admitting: Women's Health

## 2020-06-20 ENCOUNTER — Ambulatory Visit (INDEPENDENT_AMBULATORY_CARE_PROVIDER_SITE_OTHER): Payer: BC Managed Care – PPO | Admitting: Women's Health

## 2020-06-20 NOTE — Patient Instructions (Addendum)
AREA FAMILY PRACTICE PHYSICIANS  [place] ([address]) . [facility] o [address] [place], [place] [address] o [phone] o Mon-Fri 8:30-12:30, 1:30-5:00 o Accepting Medicaid . [facility] o [address], [place], [place] [address] o [phone] o Mon-Fri 8:00-5:30 . [facility] o [address] [place], [place] [address] o [phone] o Mon, Tue, Thur, Fri 8:30-5:00, Wed 10:00-7:00 (closed 1-2pm) o Accepting Medicaid . [facility] o [address], [place], [place]  [address] o Phone - [phone]   Fax - [phone]  [place] ([address]) . [facility] o [address] [place], [place] [address] o [phone] o Mon-Fri 8:00-5:00 . [facility] [place] ([facility]  o [address] [place], [place] [address] o [phone] o Mon-Fri 8:30-5:30, Sat (Oct.-Mar.) 9:00-1:00 o Accepting Medicaid  [place] ([address]) . [facility] o [address], [place], [place] [address] o [phone] o Mon-Fri 8:00-5:00  [place] ([address]) . [facility] [employer] o [address], [place], [place] [address] o [phone] o Mon-Fri 8:00-5:00 . [facility] o [address], [place], [place] [address] o [phone] o Mon-Fri 8:00-5:00 . [facility] o [address] [place], [place] [address] o [phone] o Mon-Fri 8:00-5:00 . [facility] o [address] [place] [address] o [phone] o Mon-Fri 7:30-5:30  [place] ([address] & [address]) . [facility] o [address] [place], [place] [address] o [phone] o Mon-Thur 8:00-6:00 o Accepting Medicaid . [facility] o [address] [place], [place] [address] o [phone] o Mon-Thur 7:30-7:30, Fri 7:30-4:30 o Accepting Medicaid . [facility] o [address], [place], [place]  [address] o [phone]   Fax - [phone]  [place] ([address] & [address]) . [facility] o [address] [place], [place] [address] o [phone] o Mon-Fri 7:00-5:00 . [facility] o [address], [place], [place] [address] o [phone] o Mon-Fri 8:00-5:00 o Accepting Medicaid . [facility] o [address],  [place], [place] [address] o [phone] o Mon-Fri 8:00-5:00 o Accepting Medicaid  [place] ([address]) . [facility] [place] o [address] [place], [place] [address] o [phone] o Mon-Fri 8:00-5:00 . [facility] o [address], [place], [place] [address] o [phone] o Mon-Fri 8:00-5:00 o Accepting Medicaid . [facility] o [address], [place], [place] [address] o [phone] o Mon-Fri 8:00-5:30, Sat&Sun by appointment (phones open at 8:30) o Accepting Medicaid  [place] ([address]) . [facility] o [address] [place], [place] [address] o [phone] o Mon-Thur 8:00-7:00, Fri 8:00-5:00, Sat 8:00-12:00, Sun 9:00-12:00 o Accepting Medicaid . [facility] [place] o [address], [place], [place] [address] o [phone] o Mon-Thur 8:00-5:00 o Accepting Medicaid . [facility] o [address] [place],  [address] o [phone] o Mon-Fri 8:00-5:30, Sat (Oct.-Mar.) 9:00-1:00 o Accepting Medicaid  [place]  ([address]) .   [facility] o [address] [place], [place] [address] o [phone] o Mon-Fri 8:00-5:00 o Accepting Medicaid   [facility] [phone]) . [facility] o [address], [place], [place] [address] o [phone] o Mon-Fri 8:00-5:00 . Nature conservation officer at [facility] o [address], [place], [place] [address] o [phone] o Mon-Fri 8:00-5:00 . [facility] [place] o [address], [place], [place] [address] o [phone] o Mon-Fri 8:00-5:00 o After hours clinic [facility] [place], [place] [address]) [phone] Mon-Fri 5:00-8:00, Sat 12:00-6:00, Sun 10:00-4:00 o Accepting Medicaid . [facility] o [address], [place], [place]  [address] o [phone]   Fax - [phone]  [place] [phone]) . Nature conservation officer at [facility] o [address] [place], [place] [address] o [phone] o Mon-Fri 8:00-5:00 . [facility] [place] [facility]) o [address] [place], [place] [address] o [phone] o Mon-Thur 8:00-7:00, Fri 8:00-5:00, Sat 8:00-12:00             Contraception Choices - www.bedsider.org Contraception, also called birth control, refers to methods or devices that prevent pregnancy. Hormonal methods Contraceptive implant A contraceptive implant is a thin, plastic tube that contains a hormone that prevents pregnancy. It is different from an intrauterine device (IUD). It is inserted into the upper part of the arm by a health care provider. Implants can be effective for up to 3 years. Progestin-only injections Progestin-only injections are injections of progestin, a synthetic form of the hormone progesterone. They are given every 3 months by a health care provider. Birth control pills Birth control pills are pills that contain hormones that prevent pregnancy. They must  be taken once a day, preferably at the same time each day. A prescription is needed to use this method of contraception. Birth control patch The birth control patch contains hormones that prevent pregnancy. It is placed on the skin and must be changed once a week for three weeks and removed on the fourth week. A prescription is needed to use this method of contraception. Vaginal ring A vaginal ring contains hormones that prevent pregnancy. It is placed in the vagina for three weeks and removed on the fourth week. After that, the process is repeated with a new ring. A prescription is needed to use this method of contraception. Emergency contraceptive Emergency contraceptives prevent pregnancy after unprotected sex. They come in pill form and can be taken up to 5 days after sex. They work best the sooner they are taken after having sex. Most emergency contraceptives are available without a prescription. This method should not be used as your only form of birth control.   Barrier methods Female condom A female condom is a thin sheath that is worn over the penis during sex. Condoms keep sperm from going inside a woman's body. They can be used with a sperm-killing substance (spermicide) to increase their effectiveness. They should be thrown away after one use. Female condom A female condom is a soft, loose-fitting sheath that is put into the vagina before sex. The condom keeps sperm from going inside a woman's body. They should be thrown away after one use. Diaphragm A diaphragm is a soft, dome-shaped barrier. It is inserted into the vagina before sex, along with a spermicide. The diaphragm blocks sperm from entering the uterus, and the spermicide kills sperm.  A diaphragm should be left in the vagina for 6-8 hours after sex and removed within 24 hours. A diaphragm is prescribed and fitted by a health care provider. A diaphragm should be replaced every 1-2 years, after giving birth, after gaining more than 15 lb  (6.8 kg), and after pelvic surgery. Cervical cap A cervical cap is a round, soft latex or plastic cup that fits over the cervix. It is inserted into the vagina before sex, along with spermicide. It blocks sperm from entering the uterus. The cap should be left in place for 6-8 hours after sex and removed within 48 hours. A cervical cap must be prescribed and fitted by a health care provider. It should be replaced every 2 years. Sponge A sponge is a soft, circular piece of polyurethane foam with spermicide in it. The sponge helps block sperm from entering the uterus, and the spermicide kills sperm. To use it, you make it wet and then insert it into the vagina. It should be inserted before sex, left in for at least 6 hours after sex, and removed and thrown away within 30 hours. Spermicides Spermicides are chemicals that kill or block sperm from entering the cervix and uterus. They can come as a cream, jelly, suppository, foam, or tablet. A spermicide should be inserted into the vagina with an applicator at least 10-15 minutes before sex to allow time for it to work. The process must be repeated every time you have sex. Spermicides do not require a prescription.   Intrauterine contraception Intrauterine device (IUD) An IUD is a T-shaped device that is put in a woman's uterus. There are two types:  Hormone IUD.This type contains progestin, a synthetic form of the hormone progesterone. This type can stay in place for 3-5 years.  Copper IUD.This type is wrapped in copper wire. It can stay in place for 10 years. Permanent methods of contraception Female tubal ligation In this method, a woman's fallopian tubes are sealed, tied, or blocked during surgery to prevent eggs from traveling to the uterus. Hysteroscopic sterilization In this method, a small, flexible insert is placed into each fallopian tube. The inserts cause scar tissue to form in the fallopian tubes and block them, so sperm cannot reach an egg.  The procedure takes about 3 months to be effective. Another form of birth control must be used during those 3 months. Female sterilization This is a procedure to tie off the tubes that carry sperm (vasectomy). After the procedure, the man can still ejaculate fluid (semen). Another form of birth control must be used for 3 months after the procedure. Natural planning methods Natural family planning In this method, a couple does not have sex on days when the woman could become pregnant. Calendar method In this method, the woman keeps track of the length of each menstrual cycle, identifies the days when pregnancy can happen, and does not have sex on those days. Ovulation method In this method, a couple avoids sex during ovulation. Symptothermal method This method involves not having sex during ovulation. The woman typically checks for ovulation by watching changes in her temperature and in the consistency of cervical mucus. Post-ovulation method In this method, a couple waits to have sex until after ovulation. Where to find more information  Centers for Disease Control and Prevention: FootballExhibition.com.br Summary  Contraception, also called birth control, refers to methods or devices that prevent pregnancy.  Hormonal methods of contraception include implants, injections, pills, patches, vaginal rings, and emergency contraceptives.  Barrier methods  of contraception can include female condoms, female condoms, diaphragms, cervical caps, sponges, and spermicides.  There are two types of IUDs (intrauterine devices). An IUD can be put in a woman's uterus to prevent pregnancy for 3-5 years.  Permanent sterilization can be done through a procedure for males and females. Natural family planning methods involve nothaving sex on days when the woman could become pregnant. This information is not intended to replace advice given to you by your health care provider. Make sure you discuss any questions you have with  your health care provider. Document Revised: 07/23/2019 Document Reviewed: 07/23/2019 Elsevier Patient Education  2021 [employer].

## 2020-06-20 NOTE — Progress Notes (Signed)
Post Partum Visit Note  April [name] is a 30 y.o. G62P1001 female who presents for a postpartum visit. She is 4 weeks postpartum following a normal spontaneous vaginal delivery.  I have fully reviewed the prenatal and intrapartum course. The delivery was at 38.2 gestational weeks.  Anesthesia: epidural. Postpartum course has been uncomplicated. Baby is doing well. Baby is feeding by breast. Bleeding staining only. Bowel function is normal. Bladder function is normal. Patient is not sexually active. Contraception method is none. Postpartum depression screening: negative.   The pregnancy intention screening data noted above was reviewed. Potential methods of contraception were discussed. The patient elected to proceed with No Method - Other Reason.    Edinburgh Postnatal Depression Scale - 06/20/20 1113      Edinburgh Postnatal Depression Scale:  In the Past 7 Days   I have been able to laugh and see the funny side of things. 0    I have looked forward with enjoyment to things. 0    I have blamed myself unnecessarily when things went wrong. 0    I have been anxious or worried for no good reason. 0    I have felt scared or panicky for no good reason. 0    Things have been getting on top of me. 0    I have been so unhappy that I have had difficulty sleeping. 0    I have felt sad or miserable. 0    I have been so unhappy that I have been crying. 0    The thought of harming myself has occurred to me. 0    Edinburgh Postnatal Depression Scale Total 0           Health Maintenance Due  Topic Date Due  . COVID-19 Vaccine (1) Never done    The following portions of the patient's history were reviewed and updated as appropriate: allergies, current medications, past family history, past medical history, past social history, past surgical history and problem list.  Review of Systems Pertinent items noted in HPI and remainder of comprehensive ROS otherwise negative.  Objective:   BP 120/81 (BP Location: Left Arm, Patient Position: Sitting, Cuff Size: Large)   Pulse 80   Temp (!) 97.4 F (36.3 C) (Oral)   Ht 5\' 3"  (1.6 m)   Wt 279 lb 3.2 oz (126.6 kg)   LMP 08/25/2019   Breastfeeding Yes   BMI 49.46 kg/m    General:  alert, cooperative and no distress   Breasts:  not indicated  Lungs: clear to auscultation bilaterally  Heart:  regular rate and rhythm, S1, S2 normal, no murmur, click, rub or gallop  Abdomen: soft, non-tender; bowel sounds normal; no masses,  no organomegaly   Wound n/a  GU exam:  not indicated       Assessment:   1. Postpartum exam  Normal postpartum exam.   Plan:   Essential components of care per ACOG recommendations:  1.  Mood and well being: Patient with negative depression screening today. Reviewed local resources for support.  - Patient does not use tobacco. - hx of drug use? No  2. Infant care and feeding:  -Patient currently breastmilk feeding? Yes If breastmilk feeding discussed return to work and pumping. If needed, patient was provided letter for work to allow for every 2-3 hr pumping breaks, and to be granted a private location to express breastmilk and refrigerated area to store breastmilk. Reviewed importance of draining breast regularly to support lactation. -Social  determinants of health (SDOH) reviewed in EPIC.  3. Sexuality, contraception and birth spacing - Patient does not want a pregnancy in the next year.  Desired family size is 2 children.  - Reviewed forms of contraception in tiered fashion. Patient desired undecided today.   - Discussed birth spacing of 18 months  4. Sleep and fatigue -Encouraged family/partner/community support of 4 hrs of uninterrupted sleep to help with mood and fatigue  5. Physical Recovery  - Discussed patients delivery and complications - Patient had a Vaginal problems after delivery including second degree laceration. Perineal healing reviewed. Patient expressed understanding -  Patient has urinary incontinence? No - Patient is safe to resume physical and sexual activity, but advised to wait until deciding on method of contraception  6.  Health Maintenance - HM due items addressed - Last pap smear done 01/2019 and was normal with negative HPV. Pap smear not done at today's visit.   7. Chronic Disease - PCP follow up/list given  Marylen Ponto, NP [facility] [employer], [facility]

## 2021-06-01 IMAGING — US US MFM FETAL BPP W/O NON-STRESS
1 series · 13 of 13 positions shown · non-contrast
Comparison: none

[Series 1: us mfm fetal bpp w/o non-stress · 13 acquisitions, 13 frames shown]
[im 1/13]
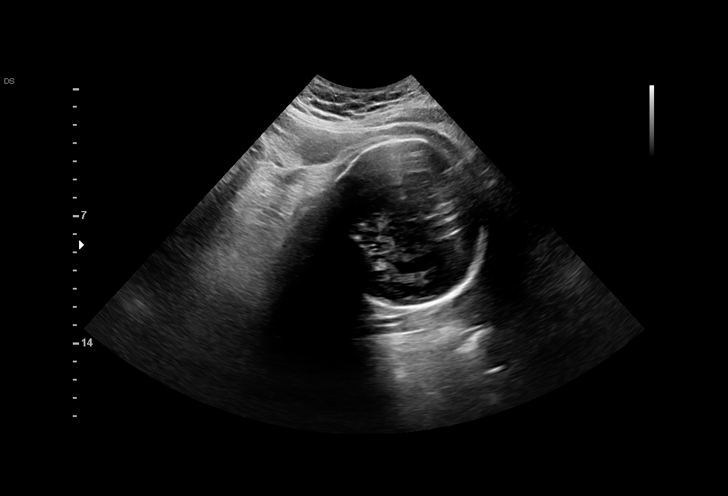
[im 2/13]
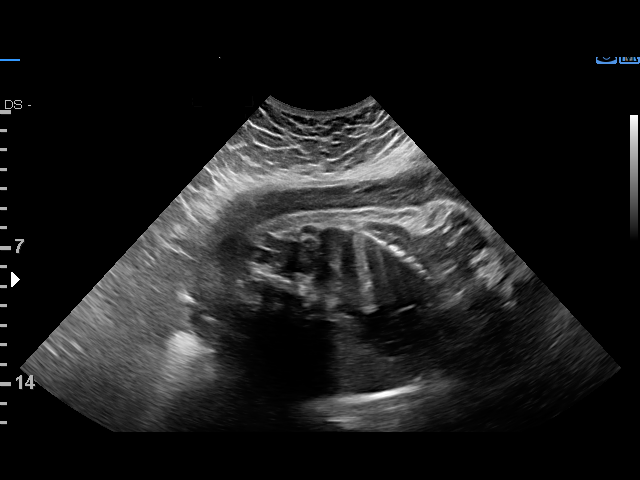
[im 3/13]
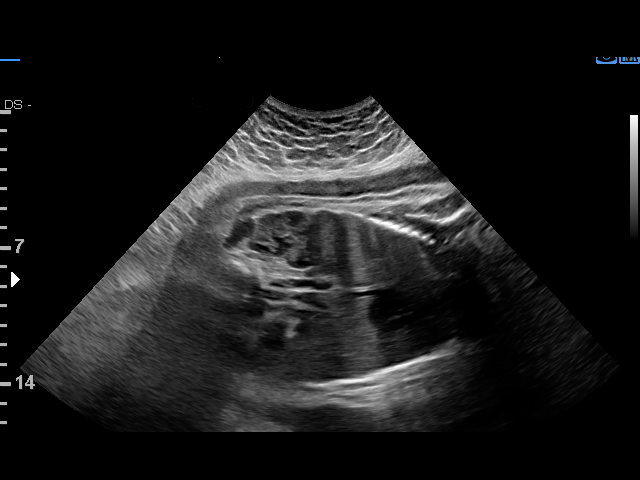
[im 4/13]
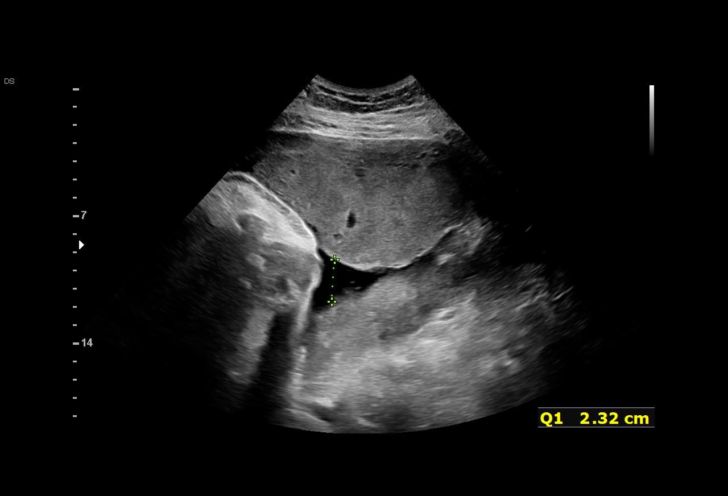
[im 5/13]
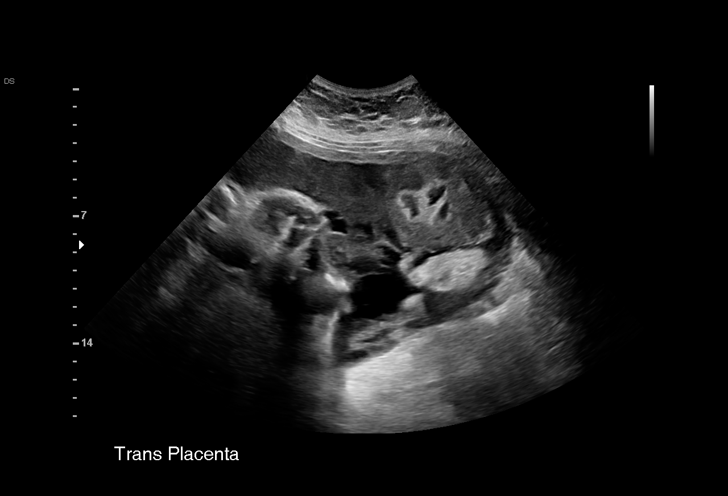
[im 6/13]
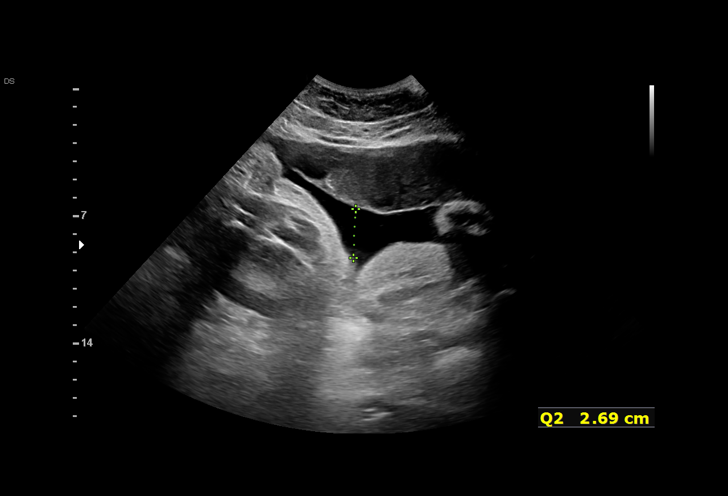
[im 7/13]
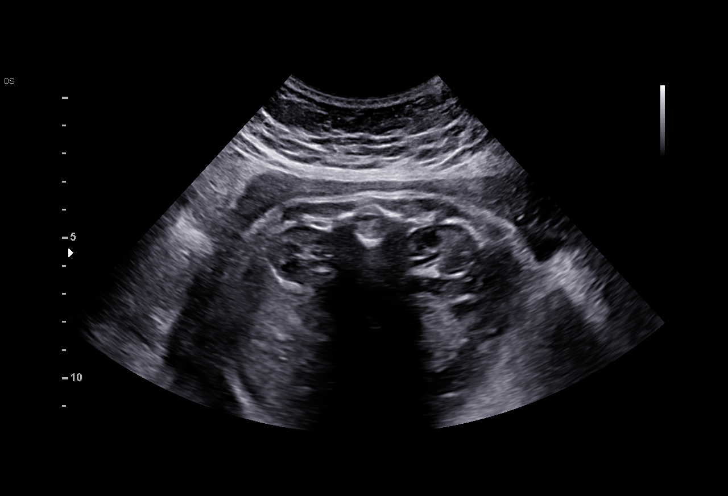
[im 8/13]
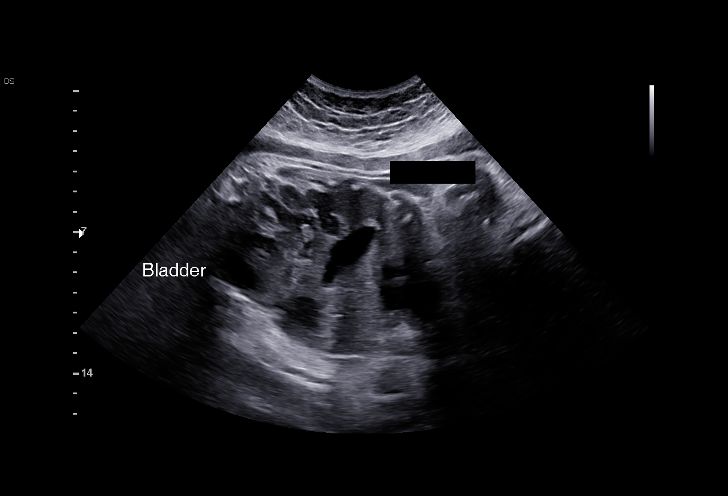
[im 9/13]
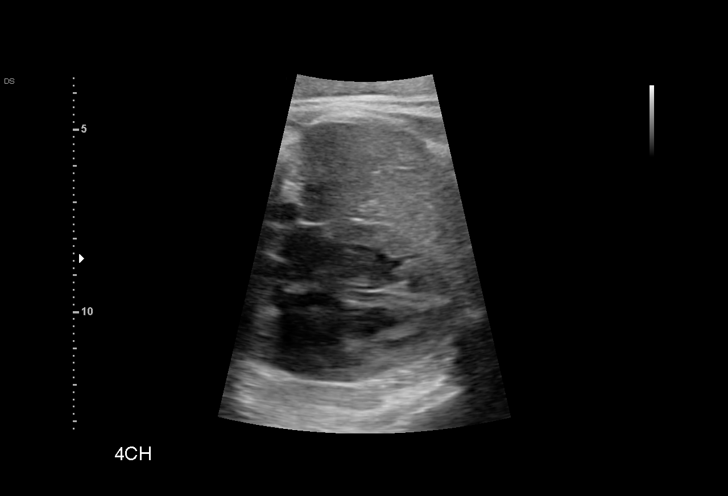
[im 10/13]
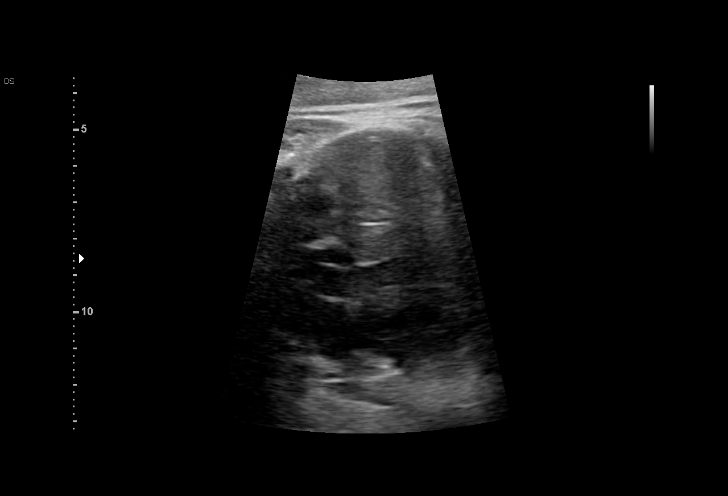
[im 11/13]
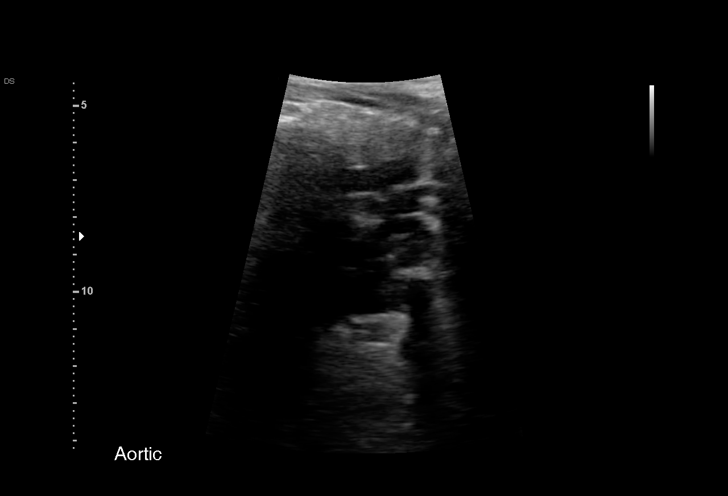
[im 12/13]
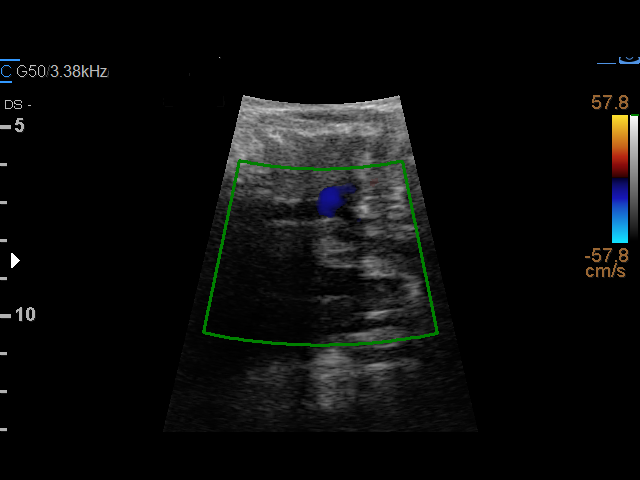
[im 13/13]
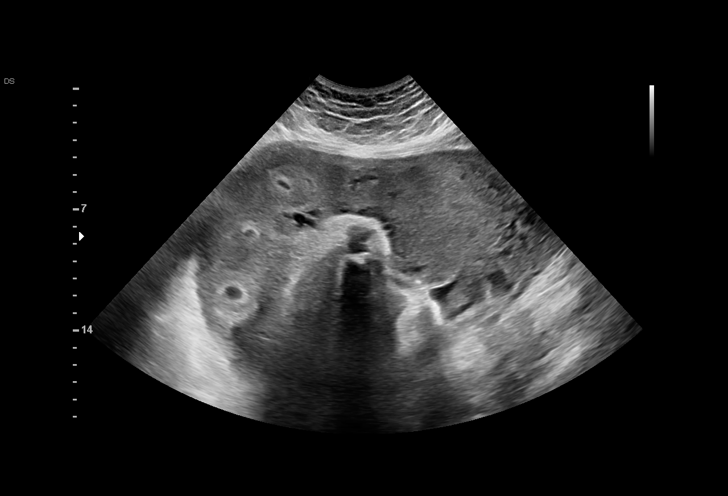

[13 of 13 positions shown; findings below may reference images not displayed]

CYNDI

 Attending:        Terii Bongo      Secondary Phy.:   ROLIKA
                                                            DE LA CRUZ CNM

Indications

 Obesity complicating pregnancy, third
 trimester (BMI 48)
 Encounter for other antenatal screening
 follow-up
 37 weeks gestation of pregnancy
Fetal Evaluation

 Num Of Fetuses:         1
 Cardiac Activity:       Observed
 Presentation:           Cephalic
 Placenta:               Anterior
 P. Cord Insertion:      Previously Visualized

 Amniotic Fluid
 AFI FV:      Subjectively decreased

 AFI Sum(cm)     %Tile       Largest Pocket(cm)
 6.5             < 3

 RUQ(cm)                     LUQ(cm)


 Comment:    Multiple echolucent areas in the placenta surrounded by
             echogenic rim. Possible small infarcts.
Biophysical Evaluation

 Amniotic F.V:   Pocket => 2 cm             F. Tone:        Observed
 F. Movement:    Observed                   Score:          [DATE]
 F. Breathing:   Observed
OB History

 Gravidity:    1         Term:   0
 Living:       0
Gestational Age

 LMP:           37w 5d        Date:  08/25/19                 EDD:   05/31/20
 Best:          37w 5d     Det. By:  LMP  (08/25/19)          EDD:   05/31/20
Anatomy

 Cranium:               Appears normal         Ductal Arch:            Appears normal
 Heart:                 Appears normal         Stomach:                Appears normal, left
                        (4CH, axis, and                                sided
                        situs)
 RVOT:                  Appears normal         Kidneys:                Appear normal
 LVOT:                  Appears normal         Bladder:                Appears normal
 Aortic Arch:           Appears normal

 Other:  Other anatomy previously imaged.
Impression

 Antenatal testing due to elevated maternal BMI.
 Biophysical profile is [DATE] with subjectively low amniotic fluid,
 but normal measurements.

 She reports good fetal movement
 I recommended maternal hydration and repeat amnoitic fluid
 volume in 1 week.

 If amniotic fluid continues to be low consider delivery
 between 38-39 weeks.
